# Patient Record
Sex: Female | Born: 1995 | Race: White | Hispanic: No | Marital: Single | Smoking: Never smoker
Health system: Southern US, Community
[De-identification: ages and names within clinical notes are randomized; demographics above are authoritative.]

## PROBLEM LIST (undated history)

## (undated) DIAGNOSIS — D649 Anemia, unspecified: Secondary | ICD-10-CM

## (undated) DIAGNOSIS — E669 Obesity, unspecified: Secondary | ICD-10-CM

## (undated) HISTORY — PX: MOUTH SURGERY: SHX715

## (undated) HISTORY — DX: Obesity, unspecified: E66.9

---

## 2016-02-23 ENCOUNTER — Ambulatory Visit
Admission: EM | Admit: 2016-02-23 | Discharge: 2016-02-23 | Disposition: A | Discharge status: Home / Self Care | Payer: BC Managed Care – PPO | Attending: Family Medicine | Admitting: Family Medicine

## 2016-02-23 DIAGNOSIS — H65192 Other acute nonsuppurative otitis media, left ear: Secondary | ICD-10-CM | POA: Diagnosis not present

## 2016-02-23 DIAGNOSIS — J029 Acute pharyngitis, unspecified: Secondary | ICD-10-CM | POA: Diagnosis not present

## 2016-02-23 LAB — RAPID STREP SCREEN (MED CTR MEBANE ONLY): STREPTOCOCCUS, GROUP A SCREEN (DIRECT): NEGATIVE

## 2016-02-23 MED ORDER — AMOXICILLIN 500 MG PO CAPS: 1000.0000 mg | ORAL_CAPSULE | Freq: Three times a day (TID) | ORAL | 0 refills | Status: AC

## 2016-02-23 NOTE — ED Provider Notes (Signed)
CSN: [id number]     Arrival date & time 02/23/16  4098 History   First MD Initiated Contact with Patient 02/23/16 [phone]     Chief Complaint  Patient presents with  . Otalgia  . Sore Throat   (Consider location/radiation/quality/duration/timing/severity/associated sxs/prior Treatment) Ms. Morgan Mendoza is a well-appearing 20 y.o female, presents today for Otalgia and Sore throat of 7 days' duration. She was seem by her PCP 7 days ago and was negative for strep. She was prescribed Ofloxacin Ophthalmic 0.3% to use for her ear pain. Patient presents today reporting that she is not improving, instead if worsening. She states that she "have never been in so much pain". She states the sore throat is miserable to swallow and her ear pain is 10/10. She endorses fever, chills, fatigue, congestion, sneezing, difficulty swallowing due to pain, coughing, body ache, and nausea and vomiting. The nausea and vomiting started yesterday.       History reviewed. No pertinent past medical history. History reviewed. No pertinent surgical history. History reviewed. No pertinent family history. Social History  Substance Use Topics  . Smoking status: Never Smoker  . Smokeless tobacco: Never Used  . Alcohol use Yes   OB History    No data available     Review of Systems  Constitutional: Positive for chills, fatigue and fever.  HENT: Positive for congestion, ear pain, rhinorrhea, sinus pressure, sneezing, sore throat and trouble swallowing. Negative for ear discharge.   Eyes: Negative for pain.  Respiratory: Positive for cough. Negative for shortness of breath.   Cardiovascular: Negative for chest pain, palpitations and leg swelling.  Gastrointestinal: Positive for nausea and vomiting. Negative for abdominal distention and diarrhea.  Musculoskeletal: Positive for myalgias. Negative for arthralgias.  Skin: Negative for rash.  Neurological: Positive for dizziness and headaches. Negative for syncope and weakness.     Allergies  Review of patient's allergies indicates no known allergies.  Home Medications   Prior to Admission medications   Medication Sig Start Date End Date Taking? Authorizing Provider  norethindrone-ethinyl estradiol-iron (ESTROSTEP FE,TILIA FE,TRI-LEGEST FE) 1-20/1-30/1-35 MG-MCG tablet Take 1 tablet by mouth daily.   Yes Historical Provider, MD  amoxicillin (AMOXIL) 500 MG capsule Take 2 capsules (1,000 mg total) by mouth 3 (three) times daily. 02/23/16 03/01/16  Lucia Estelle, NP   Meds Ordered and Administered this Visit  Medications - No data to display  BP 108/64 (BP Location: Left Arm)   Pulse (!) 103   Temp 99.1 F (37.3 C) (Oral)   Resp 18   Ht 5\' 9"  (1.753 m)   Wt 260 lb (117.9 kg)   LMP 02/08/2016   SpO2 100%   BMI 38.40 kg/m  No data found.   Physical Exam  Constitutional: She is oriented to person, place, and time. She appears well-developed and well-nourished.  HENT:  Head: Normocephalic and atraumatic.  Right Ear: External ear normal.  Left Ear: External ear normal.  Left tympanic membrane erythema present, no perforation. Right TM normal on exam. Tonsils 3+ with redness but no exudate. No peritonsillar abscess.   Eyes: Conjunctivae and EOM are normal. Pupils are equal, round, and reactive to light.  Neck: Normal range of motion. Neck supple.  Cardiovascular: Normal rate, regular rhythm and normal heart sounds.   Pulmonary/Chest: Effort normal and breath sounds normal. No respiratory distress.  Abdominal: Soft. Bowel sounds are normal. She exhibits no distension. There is no tenderness.  Lymphadenopathy:    She has no cervical adenopathy.  Neurological: She is alert  and oriented to person, place, and time.  Skin: Skin is warm. No rash noted.  Nursing note and vitals reviewed.   Urgent Care Course   Clinical Course    Procedures (including critical care time)  Labs Review Labs Reviewed  RAPID STREP SCREEN (NOT AT [facility])  CULTURE, GROUP A STREP  [facility])    Imaging Review No results found.    MDM   1. Other acute nonsuppurative otitis media of left ear   2. Pharyngitis    Prescriptions given for amoxicillin for AOM and presumptively for strep. Rapid strep negative however culture is pending. Also do salt water gargle, honey, butterscotch candy, throat lozenges for symptomatic relief. Take ibuprofen/tylenol for body ache and for fever (if present). Reviewed directions for usage and side effects. Patient states understanding and will call with questions or problems. Patient instructed to call or follow up with his/her primary care doctor if failure to improve or change in symptoms. Discharge instruction given. Patient denies any questions.     Lucia EstelleFeng Lulabelle Desta, NP 02/23/16 1017

## 2016-02-23 NOTE — ED Triage Notes (Signed)
Patient c/o ear ache and sore throat for about a week. She saw her PCP this week and prescribed her an ear drop. She hasn't gotten any better and is still having sore throat and ear pain.

## 2016-02-26 LAB — CULTURE, GROUP A STREP (THRC)

## 2016-02-27 ENCOUNTER — Telehealth (HOSPITAL_COMMUNITY): Payer: Self-pay | Admitting: Emergency Medicine

## 2016-02-27 NOTE — Telephone Encounter (Signed)
-----   Message from Morgan MooreLaura W Murray, MD sent at 02/26/2016  3:09 PM EDT ----- Please let patient know that throat cx was positive for a few beta hemolytic, non-group A strep. [facility].  Recheck or followup PCP for further evaluation if symptoms persist.  LM

## 2016-02-27 NOTE — Telephone Encounter (Signed)
[facility]  LM on pt's VM Need to see how pt is doing and to give lab results from recent visit on 9/16 Also let pt know labs can be obtained from MyChart

## 2016-09-19 ENCOUNTER — Encounter: Payer: Self-pay | Admitting: Certified Nurse Midwife

## 2016-09-19 ENCOUNTER — Ambulatory Visit (INDEPENDENT_AMBULATORY_CARE_PROVIDER_SITE_OTHER): Payer: BC Managed Care – PPO | Admitting: Certified Nurse Midwife

## 2016-09-19 VITALS — BP 130/70 | HR 89 | Ht 69.0 in | Wt 287.0 lb

## 2016-09-19 DIAGNOSIS — Z01419 Encounter for gynecological examination (general) (routine) without abnormal findings: Secondary | ICD-10-CM | POA: Diagnosis not present

## 2016-09-19 DIAGNOSIS — Z30011 Encounter for initial prescription of contraceptive pills: Secondary | ICD-10-CM | POA: Diagnosis not present

## 2016-09-19 DIAGNOSIS — Z309 Encounter for contraceptive management, unspecified: Secondary | ICD-10-CM | POA: Insufficient documentation

## 2016-09-19 DIAGNOSIS — Z124 Encounter for screening for malignant neoplasm of cervix: Secondary | ICD-10-CM

## 2016-09-19 MED ORDER — NORETHINDRON-ETHINYL ESTRAD-FE 1-20/1-30/1-35 MG-MCG PO TABS: 1.0000 | ORAL_TABLET | Freq: Every day | ORAL | 3 refills | Status: DC

## 2016-09-19 NOTE — Progress Notes (Signed)
Gynecology Annual Exam  PCP: Pcp Not In System  Chief Complaint:  Chief Complaint  Patient presents with  . Gynecologic Exam    History of Present Illness: Patient is a 21 y.o. G0P0000 presents for a NP annual exam. She has recently moved  from [place], [place] The patient has no complaints today. She wishes to resume her birth control pills. Her last annual was August 2016. She ran out of BCPs shortly after her move. Her menses off the pills have been regular, every 30 days, and usually they last 5 days with a moderate flow. Her LMP was 09/11/2016. She occasionally will have backache and thigh pain with her menses. She does not take medication for these problems. She is sexually active with her partner of 6 years. She is not using any contraception at this time.Her partner, Molly Maduro, will be deploying for a year tour soon.She declines STD testing.   The patient does not perform self breast exams.  There is no notable family history of breast or ovarian cancer in her family. She has never used tobacco products. She drinks 1-2 beer/ week.  [place] has regular exercise: yes.  She recently resumed exercising 4x/week. Current BMI=42.5kg/m2 She does get adequate calcium in her diet..   Her last cholesterol screen was in July 2017 and was normal.  Review of Systems: Review of Systems  Constitutional: Negative for chills, fever and weight loss.  HENT: Negative for congestion, sinus pain and sore throat.   Eyes: Negative for blurred vision and pain.  Respiratory: Negative for hemoptysis, shortness of breath and wheezing.   Cardiovascular: Negative for chest pain, palpitations and leg swelling.  Gastrointestinal: Negative for abdominal pain, blood in stool, diarrhea, heartburn, nausea and vomiting.  Genitourinary: Negative for dysuria, frequency, hematuria and urgency.  Musculoskeletal: Negative for back pain, joint pain and myalgias.  Skin: Negative for itching and rash.  Neurological:  Negative for dizziness, tingling and headaches.  Endo/Heme/Allergies: Negative for environmental allergies and polydipsia. Does not bruise/bleed easily.       Negative for hirsutism   Psychiatric/Behavioral: Negative for depression. The patient is not nervous/anxious and does not have insomnia.     Past Medical History:  Past Medical History:  Diagnosis Date  . Obesity    BMI 42 on 09/19/2016    Past Surgical History:  Past Surgical History:  Procedure Laterality Date  . MOUTH SURGERY     had tooth removed    Family History:  Family History  Problem Relation Age of Onset  . Hypertension Mother     also has a benign brain tumor  . Muscular dystrophy Cousin   . Breast cancer Neg Hx   . Deep vein thrombosis Neg Hx     Social History:  Social History   Social History  . Marital status: Single    Spouse name: N/A  . Number of children: N/A  . Years of education: N/A   Occupational History  . 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 Smoking status: Never Smoker  . Smokeless tobacco: Never Used  . Alcohol use 0.6 - 1.2 oz/week    1 - 2 Cans of beer per week  . Drug use: No  . Sexual activity: Yes    Partners: Male    Birth control/ protection: None   Other Topics Concern  . Not on file   Social History Narrative  . No narrative on file  Allergies:  No Known Allergies  Medications: Prior to Admission medications   Medication Sig Start Date End Date Taking? Authorizing Provider  norethindrone-ethinyl estradiol-iron (ESTROSTEP FE,TILIA FE,TRI-LEGEST FE) 1-20/1-30/1-35 MG-MCG tablet Take 1 tablet by mouth daily. 09/19/16  Yes Farrel Conners, CNM    Physical Exam Vitals: Blood pressure 130/70, pulse 89, height  (1.753 m), weight 130.2 kg (287 lb), last menstrual period 09/09/2016.  General: NAD, pleasant White female HEENT: normocephalic, anicteric Thyroid: no enlargement, no palpable nodules Pulmonary: No  increased work of breathing, CTAB Cardiovascular: RRR without murmur Breast: Breast symmetrical, no tenderness, no palpable nodules or masses, no skin or nipple retraction present, no nipple discharge.  No axillary, infraclavicular, or supraclavicular lymphadenopathy. Abdomen: obese, soft, non-tender, non-distended.  Umbilicus without lesions.  No hepatomegaly or masses palpable. No evidence of hernia  Genitourinary:  External: Normal external female genitalia.  Normal urethral meatus, normal Bartholin's and Skene's glands.    Vagina: Normal vaginal mucosa, no evidence of prolapse.    Cervix: Grossly normal in appearance, no bleeding, nullip  Uterus: AV, Non-enlarged, mobile, normal contour.    Adnexa: ovaries non-enlarged, no adnexal masses  Rectal: deferred  Lymphatic: no evidence of inguinal lymphadenopathy Extremities: no edema, erythema, or tenderness Neurologic: Grossly intact Psychiatric: mood appropriate, affect full      Assessment: 21 y.o. NP well woman exam Contraceptive management  Plan:  1) Pap smear done  2) STI screening was offered and declined  3) Contraception -RX for [employer] generic e-prescribed  4) Recommend monthly self breast exam  5) RTO in 1 year for annual  Farrel Conners, [place]

## 2016-09-23 LAB — IGP,RFX APTIMA HPV ALL PTH: PAP Smear Comment: 0

## 2016-09-29 ENCOUNTER — Ambulatory Visit
Admission: RE | Admit: 2016-09-29 | Discharge: 2016-09-29 | Disposition: A | Discharge status: Home / Self Care | Payer: BC Managed Care – PPO | Source: Ambulatory Visit | Attending: Emergency Medicine | Admitting: Emergency Medicine

## 2016-09-29 ENCOUNTER — Ambulatory Visit
Admission: EM | Admit: 2016-09-29 | Discharge: 2016-09-29 | Disposition: A | Discharge status: Home / Self Care | Payer: BC Managed Care – PPO | Attending: Family Medicine | Admitting: Family Medicine

## 2016-09-29 ENCOUNTER — Encounter: Payer: Self-pay | Admitting: *Deleted

## 2016-09-29 DIAGNOSIS — R10811 Right upper quadrant abdominal tenderness: Secondary | ICD-10-CM

## 2016-09-29 DIAGNOSIS — R1011 Right upper quadrant pain: Secondary | ICD-10-CM | POA: Diagnosis present

## 2016-09-29 DIAGNOSIS — R197 Diarrhea, unspecified: Secondary | ICD-10-CM | POA: Diagnosis not present

## 2016-09-29 DIAGNOSIS — R112 Nausea with vomiting, unspecified: Secondary | ICD-10-CM

## 2016-09-29 MED ORDER — ONDANSETRON 8 MG PO TBDP: 8.0000 mg | ORAL_TABLET | Freq: Two times a day (BID) | ORAL | 0 refills | Status: DC

## 2016-09-29 MED ORDER — ONDANSETRON 8 MG PO TBDP
8.0000 mg | ORAL_TABLET | Freq: Once | ORAL | Status: AC
  Administered 2016-09-29: 8 mg via ORAL

## 2016-09-29 NOTE — ED Provider Notes (Signed)
CSN: [id number]     Arrival date & time 09/29/16  1019 History   First MD Initiated Contact with Patient 09/29/16 1204     Chief Complaint  Patient presents with  . Emesis  . Nausea  . Headache  . Diarrhea  . Chills  . 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 She states that she has not had any nausea vomiting since that period of time but started having diarrhea the following day to 5 bowel movements daily. She denies any blood or mucus in the stools. She has had umbilical and right upper quadrant pain. Prior to the onset of the diarrhea she ate at [employer] [place] sandwich. Had head congestion as well as headache she states most likely from not eating. She is able to take in liquids without any difficulty. She has had no previous surgeries. Temperature today is 99.1, pulse 79, blood pressure 123/64, respirations 16 and O2 sats 100%       Past Medical History:  Diagnosis Date  . Obesity    BMI 42 on 09/19/2016   Past Surgical History:  Procedure Laterality Date  . MOUTH SURGERY     had tooth removed   Family History  Problem Relation Age of Onset  . Hypertension Mother     also has a benign brain tumor  . Muscular dystrophy Cousin   . Breast cancer Neg Hx   . Deep vein thrombosis Neg Hx    Social History  Substance Use Topics  . Smoking status: Never Smoker  . Smokeless tobacco: Never Used  . Alcohol use 0.6 - 1.2 oz/week    1 - 2 Cans of beer per week   OB History    Gravida Para Term Preterm AB Living   0 0 0 0 0 0   SAB TAB Ectopic Multiple Live Births   0 0 0 0 0     Review of Systems  Constitutional: Positive for activity change, appetite change and fatigue. Negative for chills and fever.  HENT: Positive for ear pain.   Gastrointestinal: Positive for  abdominal pain, diarrhea, nausea and vomiting.  All other systems reviewed and are negative.   Allergies  Other  Home Medications   Prior to Admission medications   Medication Sig Start Date End Date Taking? Authorizing Provider  norethindrone-ethinyl estradiol-iron (ESTROSTEP FE,TILIA FE,TRI-LEGEST FE) 1-20/1-30/1-35 MG-MCG tablet Take 1 tablet by mouth daily. 09/19/16  Yes Dalia Heading, CNM  ondansetron (ZOFRAN ODT) 8 MG disintegrating tablet Take 1 tablet (8 mg total) by mouth 2 (two) times daily. 09/29/16   Lorin Picket, PA-C   Meds Ordered and Administered this Visit   Medications  ondansetron (ZOFRAN-ODT) disintegrating tablet 8 mg (8 mg Oral Given 09/29/16 1256)    BP 123/64 (BP Location: Left Arm)   Pulse 79   Temp 99.1 F (37.3 C) (Oral)   Resp 16   Ht _0  (1.753 m)   Wt 245 lb (111.1 kg)   LMP 09/09/2016 (Exact Date)   SpO2 100%   BMI 36.18 kg/m  No data found.   Physical Exam  Constitutional: She is oriented to person, place, and time. She appears well-developed and well-nourished. No distress.  HENT:  Head: Normocephalic.  Eyes: Pupils are equal, round, and reactive to light.  Neck: Normal range of motion.  Pulmonary/Chest: Effort normal  and breath sounds normal. No respiratory distress. She has no wheezes. She has no rales.  Abdominal: Soft.  Abdominal exam was performed in the presence of Melissa, CMA as Education officer, museum. Patient is morbidly obese. Bowel sounds are hypotonic throughout. She has tenderness to deep palpation in the right upper quadrant with a mild Murphy's sign. Is also tender in the mid right quadrant superior to the umbilicus. She has no guarding no rebound tenderness. No masses are palpable.  Musculoskeletal: Normal range of motion.  Neurological: She is alert and oriented to person, place, and time.  Skin: Skin is warm and dry.  Psychiatric: She has a normal mood and affect. Her behavior is normal. Judgment and thought  content normal.  Nursing note and vitals reviewed.   Urgent Care Course     Procedures (including critical care time)  Labs Review Labs Reviewed - No data to display  Imaging Review US Abdomen Limited Ruq  Result Date: 09/29/2016 CLINICAL DATA:  Nausea, vomiting, diarrhea and RIGHT upper quadrant pain for 4 days EXAM: US ABDOMEN LIMITED - RIGHT UPPER QUADRANT COMPARISON:  None FINDINGS: Gallbladder: Normally distended without stones or wall thickening. Question minimal sludge. No pericholecystic fluid or sonographic Murphy sign. Common bile duct: Diameter: 6 mm diameter, upper normal Liver: Normal appearance without mass or intrahepatic biliary dilatation. Hepatopetal portal venous flow. No RIGHT upper quadrant free fluid. IMPRESSION: Question minimal sludge within gallbladder. Otherwise negative exam. Electronically Signed   By: Lavonia Dana M.D.   On: 09/29/2016 15:39     Visual Acuity Review  Right Eye Distance:   Left Eye Distance:   Bilateral Distance:    Right Eye Near:   Left Eye Near:    Bilateral Near:     Medications  ondansetron (ZOFRAN-ODT) disintegrating tablet 8 mg (8 mg Oral Given 09/29/16 1256)      MDM   1. Nausea vomiting and diarrhea   2. Right upper quadrant abdominal tenderness without rebound tenderness    Discharge Medication List as of 09/29/2016  1:01 PM    START taking these medications   Details  ondansetron (ZOFRAN ODT) 8 MG disintegrating tablet Take 1 tablet (8 mg total) by mouth 2 (two) times daily., Starting Mon 09/29/2016, Normal      Plan: 1. Test/x-ray results and diagnosis reviewed with patient 2. rx as per orders; risks, benefits, potential side effects reviewed with patient 3. Recommend supportive treatment with Fluids when she begins eating again and she should start with a brat diet and advance as tolerated. Send her for an ultrasound today as her symptoms and physical exam findings of right upper quadrant pain with a mildly  positive Murphy's sign. The ultrasound is negative then I will obtain GI panel by PCR. Patient was given a prescription and stool sample kit. If she is not improving she should follow-up with her primary care physician Dr. Ronnald Ramp. 4. F/u prn if symptoms worsen or don't improve     Lorin Picket, PA-C 09/29/16 1307    Addendum: Phone call from ultrasound; the patient had sludge but otherwise a normal study. The patient on the phone and relayed this message to her. She states that she was having still significant pain. Return to our clinic at which time I provided her with 3 prescriptions one for Mobic 15 mg that she will take daily PRN for pain. Carafate daily 4 times daily. Vicodin 10/10/2023 as necessary for severe pain. Referred to follow-up with surgery for further evaluation and management.  Name and number of a surgical group was given to the patient. She should also follow-up with her primary care or diarrhea does not resolve in several days. Continue taking in fluids.   Lorin Picket, PA-C 09/29/16 [place] Ariann Khaimov, PA-C 09/29/16 [place] Jhamal Plucinski, PA-C 09/29/16 [phone]

## 2016-09-29 NOTE — ED Triage Notes (Signed)
Onset of N/V/D with chills last Friday. Nausea has persisted with head congestion, diarrhea, chills, and ear fullness.

## 2016-09-30 ENCOUNTER — Ambulatory Visit (INDEPENDENT_AMBULATORY_CARE_PROVIDER_SITE_OTHER): Payer: BC Managed Care – PPO | Admitting: General Surgery

## 2016-09-30 ENCOUNTER — Encounter: Payer: Self-pay | Admitting: General Surgery

## 2016-09-30 ENCOUNTER — Other Ambulatory Visit
Admission: RE | Admit: 2016-09-30 | Discharge: 2016-09-30 | Disposition: A | Discharge status: Home / Self Care | Payer: BC Managed Care – PPO | Source: Ambulatory Visit | Attending: General Surgery | Admitting: General Surgery

## 2016-09-30 VITALS — BP 131/69 | HR 91 | Temp 98.2°F | Ht 69.0 in | Wt 288.0 lb

## 2016-09-30 DIAGNOSIS — R509 Fever, unspecified: Secondary | ICD-10-CM

## 2016-09-30 DIAGNOSIS — R1011 Right upper quadrant pain: Secondary | ICD-10-CM | POA: Insufficient documentation

## 2016-09-30 LAB — RAPID INFLUENZA A&B ANTIGENS
Influenza A (ARMC): NEGATIVE
Influenza B (ARMC): NEGATIVE

## 2016-09-30 NOTE — Progress Notes (Signed)
Patient ID: Morgan Mendoza, female   DOB: 1995-08-28, 21 y.o.   MRN: [id number]  CC: ABDOMINAL PAIN  HPI Morgan Mendoza is a 21 y.o. female presents to clinic for evaluation of right upper quadrant pain. She was seen in urgent care yesterday and had an ultrasound performed which showed sludge but no stones or cholecystitis. She had no labs or other imaging performed. Patient reports that her pain actually started last Thursday after eating a [place] sandwich from [place]. This was during a field trip with children. She had numerous bouts of nausea and vomiting Thursday and Friday. None since. She's had alternating diarrhea and constipation since that time. Her belly has had intermittent abdominal pains primarily in the mid epigastrium and some to the right upper quadrant. Patient reports she's never had pain like this before. On questioning she states she can eat fried and fatty foods without any abdominal pain. She denies any current fevers, chills, nausea, vomiting, chest pain, shortness of breath. Patient was told by urgent care that the gallbladder was her problem. Upon further questioning patient reports that the real reason she went to urgent care was to make sure this wasn't the flu.  HPI  Past Medical History:  Diagnosis Date  . Obesity    BMI 42 on 09/19/2016    Past Surgical History:  Procedure Laterality Date  . MOUTH SURGERY     had tooth removed    Family History  Problem Relation Age of Onset  . Hypertension Mother     also has a benign brain tumor  . Muscular dystrophy Cousin   . Breast cancer Neg Hx   . Deep vein thrombosis Neg Hx     Social History Social History  Substance Use Topics  . Smoking status: Never Smoker  . Smokeless tobacco: Never Used  . Alcohol use Yes     Comment: occasionally    Allergies  Allergen Reactions  . Other     Strawberries, kiwi and oranges    Current Outpatient Prescriptions  Medication Sig Dispense Refill  . meloxicam (MOBIC) 15  MG tablet Take 15 mg by mouth once.    . norethindrone-ethinyl estradiol-iron (ESTROSTEP FE,TILIA FE,TRI-LEGEST FE) 1-20/1-30/1-35 MG-MCG tablet Take 1 tablet by mouth daily. 84 tablet 3  . ondansetron (ZOFRAN ODT) 8 MG disintegrating tablet Take 1 tablet (8 mg total) by mouth 2 (two) times daily. 6 tablet 0  . sucralfate (CARAFATE) 1 g tablet Take 1 tablet by mouth 3 (three) times daily.    Marland Kitchen HYDROcodone-acetaminophen (NORCO/VICODIN) 5-325 MG tablet Take 1 tablet by mouth once as needed.     No current facility-administered medications for this visit.      Review of Systems A Multi-point review of systems was asked and was negative except for the findings documented in the history of present illness  Physical Exam Blood pressure 131/69, pulse 91, temperature 98.2 F (36.8 C), temperature source Oral, height  (1.753 m), weight 130.6 kg (288 lb), last menstrual period 09/09/2016. CONSTITUTIONAL: No acute distress. EYES: Pupils are equal, round, and reactive to light, Sclera are non-icteric. EARS, NOSE, MOUTH AND THROAT: The oropharynx is clear. The oral mucosa is pink and moist. Hearing is intact to voice. LYMPH NODES:  Lymph nodes in the neck are normal. RESPIRATORY:  Lungs are clear. There is normal respiratory effort, with equal breath sounds bilaterally, and without pathologic use of accessory muscles. CARDIOVASCULAR: Heart is regular without murmurs, gallops, or rubs. GI: The abdomen is large, soft, minimally tender  to deep palpation in the right upper quadrant with a negative Murphy sign, and nondistended. There are no palpable masses. There is no hepatosplenomegaly. There are normal bowel sounds in all quadrants. GU: Rectal deferred.   MUSCULOSKELETAL: Normal muscle strength and tone. No cyanosis or edema.   SKIN: Turgor is good and there are no pathologic skin lesions or ulcers. NEUROLOGIC: Motor and sensation is grossly normal. Cranial nerves are grossly intact. PSYCH:   Oriented to person, place and time. Affect is normal.  Data Reviewed Ultrasound from yesterday shows minimal sludge but no evidence of gallstones, gallbladder wall thickening, pericholecystic fluid, ductal dilatation. I have personally reviewed the patient's imaging, laboratory findings and medical records.    Assessment    Abdominal pain    Plan    21 year old female with abdominal pain that is most consistent with an enteritis. The only workup performed by urgent care was an ultrasound which showed sludge and no evidence of biliary disease. Given the lack of findings on the ultrasound and the presenting history discussed with the patient that this should be a self-limiting enteritis. Although, since she is worried about the flu we will order a flu test. Discussed at length the signs and symptoms of biliary colic and to return to clinic should they occur. If they recur we will then obtain labs to include liver function tests, and a repeat ultrasound versus HIDA scan depending on the time between visits. Patient voiced understanding and will follow-up in clinic on an as-needed basis. We will call her with the results of her flu test.     Time spent with the patient was 30 minutes, with more than 50% of the time spent in face-to-face education, counseling and care coordination.     Ricarda Frame, MD FACS General Surgeon 09/30/2016, 10:06 AM

## 2016-09-30 NOTE — Patient Instructions (Addendum)
Follow up with our office if you begin to feel any abdominal pain.  We will call you with your flu test results.

## 2016-10-01 ENCOUNTER — Telehealth: Payer: Self-pay

## 2016-10-01 NOTE — Telephone Encounter (Signed)
Called patient at this time to inform her of her Flu Test results. Left message for patient to call back for results.

## 2017-06-09 NOTE — L&D Delivery Note (Signed)
Delivery Note  First Stage: Labor onset: 0930 Induction: cytotec x 1, Pitocin Analgesia /Anesthesia intrapartum: Epidural SROM at 1030, clear fluid  Second Stage: Complete dilation at 1650 Onset of pushing at 1654 FHR second stage Cat I tracing  Delivery of a viable female on 09/03/17 at 1703 by R Jamar CNM delivery of fetal head in LOA position with restitution to LOT; Loose nuchal cord x 1, reduced at perineum;  Anterior then posterior shoulders delivered easily with gentle downward traction. Baby placed on mom's chest, and attended to by peds.  Cord double clamped after cessation of pulsation, cut by FOB Cord blood sample collected   Third Stage: Placenta delivered Spontaneously intact with 3VC @ 1708, noted to have succenturiate lobe. Placenta disposition: routine disposal Uterine tone: Firm / bleeding: small  Right vaginal wall and 2nd deg perineal laceration identified  Anesthesia for repair: epidural Repair: 2-0 Vicryl CT-1 and 3-0 Vicryl SH x 1 repaired in usual fashion Est. Blood Loss (mL): 250  Complications: none  Mom to postpartum.  Baby to Couplet care / Skin to Skin.  Newborn: Birth Weight: 7#0  Apgar Scores: 8/9 Feeding planned: breast

## 2017-06-10 LAB — OB RESULTS CONSOLE VARICELLA ZOSTER ANTIBODY, IGG: Varicella: IMMUNE

## 2017-06-10 LAB — OB RESULTS CONSOLE RUBELLA ANTIBODY, IGM: Rubella: IMMUNE

## 2017-06-10 LAB — OB RESULTS CONSOLE HEPATITIS B SURFACE ANTIGEN: Hepatitis B Surface Ag: NEGATIVE

## 2017-06-11 ENCOUNTER — Encounter: Payer: BC Managed Care – PPO | Admitting: Obstetrics and Gynecology

## 2017-08-18 LAB — OB RESULTS CONSOLE GC/CHLAMYDIA
Chlamydia: NEGATIVE
Gonorrhea: NEGATIVE

## 2017-08-18 LAB — OB RESULTS CONSOLE GBS: STREP GROUP B AG: POSITIVE

## 2017-08-18 LAB — OB RESULTS CONSOLE HIV ANTIBODY (ROUTINE TESTING): HIV: NONREACTIVE

## 2017-08-18 LAB — OB RESULTS CONSOLE RPR: RPR: NONREACTIVE

## 2017-08-28 ENCOUNTER — Other Ambulatory Visit: Payer: Self-pay | Admitting: Obstetrics and Gynecology

## 2017-09-03 ENCOUNTER — Inpatient Hospital Stay: Payer: BC Managed Care – PPO | Admitting: Anesthesiology

## 2017-09-03 ENCOUNTER — Encounter: Payer: Self-pay | Admitting: *Deleted

## 2017-09-03 ENCOUNTER — Inpatient Hospital Stay
Admission: EM | Admit: 2017-09-03 | Discharge: 2017-09-05 | DRG: 806 | Disposition: A | Discharge status: Home / Self Care | Payer: BC Managed Care – PPO | Source: Ambulatory Visit | Attending: Obstetrics and Gynecology | Admitting: Obstetrics and Gynecology

## 2017-09-03 ENCOUNTER — Other Ambulatory Visit: Payer: Self-pay

## 2017-09-03 DIAGNOSIS — Z3A38 38 weeks gestation of pregnancy: Secondary | ICD-10-CM | POA: Diagnosis not present

## 2017-09-03 DIAGNOSIS — O139 Gestational [pregnancy-induced] hypertension without significant proteinuria, unspecified trimester: Secondary | ICD-10-CM | POA: Diagnosis present

## 2017-09-03 DIAGNOSIS — O99214 Obesity complicating childbirth: Secondary | ICD-10-CM | POA: Diagnosis present

## 2017-09-03 DIAGNOSIS — O99824 Streptococcus B carrier state complicating childbirth: Secondary | ICD-10-CM | POA: Diagnosis present

## 2017-09-03 DIAGNOSIS — O134 Gestational [pregnancy-induced] hypertension without significant proteinuria, complicating childbirth: Secondary | ICD-10-CM | POA: Diagnosis present

## 2017-09-03 DIAGNOSIS — K8020 Calculus of gallbladder without cholecystitis without obstruction: Secondary | ICD-10-CM | POA: Diagnosis present

## 2017-09-03 DIAGNOSIS — O9081 Anemia of the puerperium: Secondary | ICD-10-CM | POA: Diagnosis not present

## 2017-09-03 DIAGNOSIS — O26893 Other specified pregnancy related conditions, third trimester: Secondary | ICD-10-CM | POA: Diagnosis present

## 2017-09-03 DIAGNOSIS — O9962 Diseases of the digestive system complicating childbirth: Secondary | ICD-10-CM | POA: Diagnosis present

## 2017-09-03 DIAGNOSIS — Z6791 Unspecified blood type, Rh negative: Secondary | ICD-10-CM

## 2017-09-03 DIAGNOSIS — D62 Acute posthemorrhagic anemia: Secondary | ICD-10-CM | POA: Diagnosis not present

## 2017-09-03 HISTORY — DX: Anemia, unspecified: D64.9

## 2017-09-03 LAB — URINALYSIS, COMPLETE (UACMP) WITH MICROSCOPIC
Bilirubin Urine: NEGATIVE
GLUCOSE, UA: NEGATIVE mg/dL
KETONES UR: NEGATIVE mg/dL
Nitrite: NEGATIVE
PROTEIN: 30 mg/dL — AB
Specific Gravity, Urine: 1.024 (ref 1.005–1.030)
pH: 6 (ref 5.0–8.0)

## 2017-09-03 LAB — COMPREHENSIVE METABOLIC PANEL
ALBUMIN: 2.7 g/dL — AB (ref 3.5–5.0)
ALT: 29 U/L (ref 14–54)
AST: 30 U/L (ref 15–41)
Alkaline Phosphatase: 129 U/L — ABNORMAL HIGH (ref 38–126)
Anion gap: 10 (ref 5–15)
BILIRUBIN TOTAL: 0.4 mg/dL (ref 0.3–1.2)
BUN: 9 mg/dL (ref 6–20)
CHLORIDE: 109 mmol/L (ref 101–111)
CO2: 19 mmol/L — AB (ref 22–32)
Calcium: 8.2 mg/dL — ABNORMAL LOW (ref 8.9–10.3)
Creatinine, Ser: 0.55 mg/dL (ref 0.44–1.00)
GFR calc Af Amer: >60 mL/min (ref >60)
GFR calc non Af Amer: >60 mL/min (ref >60)
GLUCOSE: 87 mg/dL (ref 65–99)
POTASSIUM: 4 mmol/L (ref 3.5–5.1)
SODIUM: 138 mmol/L (ref 135–145)
Total Protein: 6.6 g/dL (ref 6.5–8.1)

## 2017-09-03 LAB — CBC
HCT: 31.5 % — ABNORMAL LOW (ref 35.0–47.0)
Hemoglobin: 10.4 g/dL — ABNORMAL LOW (ref 12.0–16.0)
MCH: 25.6 pg — ABNORMAL LOW (ref 26.0–34.0)
MCHC: 32.9 g/dL (ref 32.0–36.0)
MCV: 77.8 fL — ABNORMAL LOW (ref 80.0–100.0)
PLATELETS: 303 10*3/uL (ref 150–440)
RBC: 4.05 MIL/uL (ref 3.80–5.20)
RDW: 15.9 % — AB (ref 11.5–14.5)
WBC: 14.7 10*3/uL — AB (ref 3.6–11.0)

## 2017-09-03 LAB — CHLAMYDIA/NGC RT PCR (ARMC ONLY)
CHLAMYDIA TR: NOT DETECTED
N GONORRHOEAE: NOT DETECTED

## 2017-09-03 LAB — PROTEIN / CREATININE RATIO, URINE
Creatinine, Urine: 256 mg/dL
PROTEIN CREATININE RATIO: 0.13 mg/mg{creat} (ref 0.00–0.15)
Total Protein, Urine: 33 mg/dL

## 2017-09-03 LAB — ABO/RH: ABO/RH(D): A NEG

## 2017-09-03 LAB — TSH: TSH: 2.335 u[IU]/mL (ref 0.350–4.500)

## 2017-09-03 MED ORDER — OXYTOCIN 40 UNITS IN LACTATED RINGERS INFUSION - SIMPLE MED
1.0000 m[IU]/min | INTRAVENOUS | Status: DC
  Administered 2017-09-03: 2 m[IU]/min via INTRAVENOUS

## 2017-09-03 MED ORDER — FENTANYL CITRATE (PF) 100 MCG/2ML IJ SOLN
50.0000 ug | INTRAMUSCULAR | Status: DC | PRN
Start: 2017-09-03 — End: 2017-09-03

## 2017-09-03 MED ORDER — ZOLPIDEM TARTRATE 5 MG PO TABS: 5.0000 mg | ORAL_TABLET | Freq: Every evening | ORAL | Status: DC | PRN

## 2017-09-03 MED ORDER — MISOPROSTOL 25 MCG QUARTER TABLET
25.0000 ug | ORAL_TABLET | ORAL | Status: DC | PRN
  Administered 2017-09-03: 25 ug via VAGINAL
  Filled 2017-09-03: qty 1

## 2017-09-03 MED ORDER — ACETAMINOPHEN 325 MG PO TABS: 650.0000 mg | ORAL_TABLET | ORAL | Status: DC | PRN

## 2017-09-03 MED ORDER — DIPHENHYDRAMINE HCL 25 MG PO CAPS: 25.0000 mg | ORAL_CAPSULE | Freq: Four times a day (QID) | ORAL | Status: DC | PRN

## 2017-09-03 MED ORDER — SIMETHICONE 80 MG PO CHEW: 80.0000 mg | CHEWABLE_TABLET | ORAL | Status: DC | PRN

## 2017-09-03 MED ORDER — FENTANYL 2.5 MCG/ML W/ROPIVACAINE 0.15% IN NS 100 ML EPIDURAL (ARMC)
EPIDURAL | Status: AC
  Filled 2017-09-03: qty 100

## 2017-09-03 MED ORDER — BENZOCAINE-MENTHOL 20-0.5 % EX AERO
1.0000 "application " | INHALATION_SPRAY | CUTANEOUS | Status: DC | PRN
  Administered 2017-09-05 (×2): 1 via TOPICAL
  Filled 2017-09-03 (×2): qty 56

## 2017-09-03 MED ORDER — SOD CITRATE-CITRIC ACID 500-334 MG/5ML PO SOLN
30.0000 mL | ORAL | Status: DC | PRN
  Administered 2017-09-03: 30 mL via ORAL
  Filled 2017-09-03: qty 15

## 2017-09-03 MED ORDER — LACTATED RINGERS IV SOLN: 500.0000 mL | INTRAVENOUS | Status: DC | PRN

## 2017-09-03 MED ORDER — MISOPROSTOL 25 MCG QUARTER TABLET
25.0000 ug | ORAL_TABLET | ORAL | Status: DC
  Administered 2017-09-03: 25 ug via BUCCAL
  Filled 2017-09-03: qty 1

## 2017-09-03 MED ORDER — CYCLOBENZAPRINE HCL 5 MG PO TABS
5.0000 mg | ORAL_TABLET | Freq: Three times a day (TID) | ORAL | Status: DC | PRN
  Administered 2017-09-03: 5 mg via ORAL
  Filled 2017-09-03 (×2): qty 1

## 2017-09-03 MED ORDER — IBUPROFEN 600 MG PO TABS: 600.0000 mg | ORAL_TABLET | Freq: Four times a day (QID) | ORAL | Status: DC

## 2017-09-03 MED ORDER — MISOPROSTOL 200 MCG PO TABS
ORAL_TABLET | ORAL | Status: AC
  Filled 2017-09-03: qty 4

## 2017-09-03 MED ORDER — ONDANSETRON HCL 4 MG/2ML IJ SOLN
4.0000 mg | INTRAMUSCULAR | Status: DC | PRN
  Administered 2017-09-03: 4 mg via INTRAVENOUS
  Filled 2017-09-03: qty 2

## 2017-09-03 MED ORDER — SENNOSIDES-DOCUSATE SODIUM 8.6-50 MG PO TABS
2.0000 | ORAL_TABLET | ORAL | Status: DC
  Administered 2017-09-04 – 2017-09-05 (×2): 2 via ORAL
  Filled 2017-09-03 (×2): qty 2

## 2017-09-03 MED ORDER — OXYTOCIN BOLUS FROM INFUSION: 500.0000 mL | Freq: Once | INTRAVENOUS | Status: DC

## 2017-09-03 MED ORDER — OXYTOCIN 10 UNIT/ML IJ SOLN
INTRAMUSCULAR | Status: AC
Start: 2017-09-03 — End: 2017-09-03
  Filled 2017-09-03: qty 2

## 2017-09-03 MED ORDER — LIDOCAINE HCL (PF) 1 % IJ SOLN
INTRAMUSCULAR | Status: DC | PRN
  Administered 2017-09-03: 3 mL via INTRADERMAL

## 2017-09-03 MED ORDER — ONDANSETRON HCL 4 MG/2ML IJ SOLN: 4.0000 mg | Freq: Once | INTRAMUSCULAR | Status: DC | PRN

## 2017-09-03 MED ORDER — LIDOCAINE HCL (PF) 1 % IJ SOLN
INTRAMUSCULAR | Status: AC
  Filled 2017-09-03: qty 30

## 2017-09-03 MED ORDER — FENTANYL 2.5 MCG/ML W/ROPIVACAINE 0.15% IN NS 100 ML EPIDURAL (ARMC)
EPIDURAL | Status: DC | PRN
  Administered 2017-09-03: 12 mL/h via EPIDURAL

## 2017-09-03 MED ORDER — ACETAMINOPHEN 325 MG PO TABS
650.0000 mg | ORAL_TABLET | ORAL | Status: DC | PRN
  Administered 2017-09-03: 650 mg via ORAL
  Filled 2017-09-03: qty 2

## 2017-09-03 MED ORDER — OXYTOCIN 40 UNITS IN LACTATED RINGERS INFUSION - SIMPLE MED
2.5000 [IU]/h | INTRAVENOUS | Status: DC
  Filled 2017-09-03: qty 1000

## 2017-09-03 MED ORDER — LACTATED RINGERS IV SOLN
INTRAVENOUS | Status: DC
  Administered 2017-09-03: 02:00:00 via INTRAVENOUS

## 2017-09-03 MED ORDER — ONDANSETRON HCL 4 MG/2ML IJ SOLN
4.0000 mg | Freq: Four times a day (QID) | INTRAMUSCULAR | Status: DC | PRN
  Administered 2017-09-03: 4 mg via INTRAVENOUS
  Filled 2017-09-03: qty 2

## 2017-09-03 MED ORDER — ONDANSETRON HCL 4 MG PO TABS: 4.0000 mg | ORAL_TABLET | ORAL | Status: DC | PRN

## 2017-09-03 MED ORDER — SODIUM CHLORIDE 0.9 % IV SOLN
5.0000 10*6.[IU] | Freq: Once | INTRAVENOUS | Status: AC
  Administered 2017-09-03: 5 10*6.[IU] via INTRAVENOUS
  Filled 2017-09-03: qty 5

## 2017-09-03 MED ORDER — PRENATAL MULTIVITAMIN CH
1.0000 | ORAL_TABLET | Freq: Every day | ORAL | Status: DC
  Administered 2017-09-04 – 2017-09-05 (×2): 1 via ORAL
  Filled 2017-09-03 (×2): qty 1

## 2017-09-03 MED ORDER — BUPIVACAINE HCL (PF) 0.25 % IJ SOLN
INTRAMUSCULAR | Status: DC | PRN
  Administered 2017-09-03 (×2): 5 mL via EPIDURAL

## 2017-09-03 MED ORDER — AMMONIA AROMATIC IN INHA
RESPIRATORY_TRACT | Status: AC
  Filled 2017-09-03: qty 10

## 2017-09-03 MED ORDER — LIDOCAINE HCL (PF) 1 % IJ SOLN: 30.0000 mL | INTRAMUSCULAR | Status: DC | PRN

## 2017-09-03 MED ORDER — OXYCODONE-ACETAMINOPHEN 5-325 MG PO TABS
1.0000 | ORAL_TABLET | Freq: Four times a day (QID) | ORAL | Status: DC | PRN
  Administered 2017-09-03: 1 via ORAL
  Filled 2017-09-03: qty 1

## 2017-09-03 MED ORDER — DIBUCAINE 1 % RE OINT: 1.0000 "application " | TOPICAL_OINTMENT | RECTAL | Status: DC | PRN

## 2017-09-03 MED ORDER — COCONUT OIL OIL: 1.0000 "application " | TOPICAL_OIL | Status: DC | PRN

## 2017-09-03 MED ORDER — IBUPROFEN 600 MG PO TABS
ORAL_TABLET | ORAL | Status: AC
  Filled 2017-09-03: qty 1

## 2017-09-03 MED ORDER — LIDOCAINE-EPINEPHRINE (PF) 1.5 %-1:200000 IJ SOLN
INTRAMUSCULAR | Status: DC | PRN
  Administered 2017-09-03: 3 mL via EPIDURAL

## 2017-09-03 MED ORDER — PENICILLIN G POT IN DEXTROSE 60000 UNIT/ML IV SOLN
3.0000 10*6.[IU] | INTRAVENOUS | Status: DC
  Administered 2017-09-03 (×2): 3 10*6.[IU] via INTRAVENOUS
  Filled 2017-09-03 (×10): qty 50

## 2017-09-03 MED ORDER — FENTANYL CITRATE (PF) 100 MCG/2ML IJ SOLN: 25.0000 ug | INTRAMUSCULAR | Status: DC | PRN

## 2017-09-03 MED ORDER — WITCH HAZEL-GLYCERIN EX PADS: 1.0000 "application " | MEDICATED_PAD | CUTANEOUS | Status: DC | PRN

## 2017-09-03 MED ORDER — IBUPROFEN 600 MG PO TABS
600.0000 mg | ORAL_TABLET | Freq: Four times a day (QID) | ORAL | Status: DC
  Administered 2017-09-03 – 2017-09-05 (×8): 600 mg via ORAL
  Filled 2017-09-03 (×7): qty 1

## 2017-09-03 MED ORDER — TERBUTALINE SULFATE 1 MG/ML IJ SOLN: 0.2500 mg | Freq: Once | INTRAMUSCULAR | Status: DC | PRN

## 2017-09-03 NOTE — Discharge Summary (Signed)
Obstetrical Discharge Summary  Patient Name: Morgan Mendoza DOB: Jun 27, 1995 MRN: [id number]  Date of Admission: 09/03/2017 Date of Delivery: 09/03/17 Delivered by: Bonnell Public CNM Date of Discharge: 09/05/2017  Primary OB: [facility]  WUJ:[id number]'W last menstrual period was 12/10/2016. EDC Estimated Date of Delivery: 09/12/17 Gestational Age at Delivery: [redacted]w[redacted]d   Antepartum complications:  1. Rh Negative, s/p Rhogam 2. Morbid obesity, BMI 42 3. Anemia 4. Gestational HTN, no meds. Dx'd at 37wks.  5. Late to care - unknown pregnancy 6. gallstones 7. GBS positive  Admitting Diagnosis: GHTN Secondary Diagnosis: SVD, 2nd deg laceration, nuchal cord x 1  Patient Active Problem List   Diagnosis Date Noted  . Gestational hypertension 09/03/2017  . Right upper quadrant abdominal pain 09/30/2016  . Contraceptive management 09/19/2016    Augmentation: Pitocin and Cytotec Complications: None Intrapartum complications/course: IOL for GHTN, uncomplicated IP course. SVD at 1703 with loose nuchal cord x 1.  Date of Delivery: 09/03/17 Delivered By: Bonnell Public CNM Delivery Type: spontaneous vaginal delivery Anesthesia: epidural Placenta: spontaneous Laceration: vaginal wall, 2nd deg perineal Episiotomy: none Newborn Data: Live born female  Birth Weight: 7 lb 0.2 oz (3180 g) APGAR: 8, 9  Newborn Delivery   Birth date/time:  09/03/2017 17:03:00 Delivery type:  Vaginal, Spontaneous     Postpartum Procedures: None  Post partum course:  Patient had an uncomplicated postpartum course.  By time of discharge on PPD#2, her pain was controlled on oral pain medications; she had appropriate lochia and was ambulating, voiding without difficulty and tolerating regular diet.  She was deemed stable for discharge to home.      Discharge Physical Exam:  BP (!) 108/56 (BP Location: Right Arm) Comment: nurse Abby Gurner notified  Pulse 74   Temp 98.4 F (36.9 C) (Oral)   Resp 18   Ht 5\' 9"   (1.753 m)   Wt 124.7 kg (275 lb)   LMP 12/10/2016   SpO2 99%   Breastfeeding? Unknown   BMI 40.61 kg/m   General: NAD CV: RRR Pulm: CTABL, nl effort ABD: s/nd/nt, fundus firm and below the umbilicus Lochia: moderate DVT Evaluation: LE non-ttp, no evidence of DVT on exam.  Hemoglobin  Date Value Ref Range Status  09/05/2017 9.8 (L) 12.0 - 16.0 g/dL Final   HCT  Date Value Ref Range Status  09/05/2017 30.0 (L) 35.0 - 47.0 % Final     Disposition: stable, discharge to home. Baby Feeding: breastmilk Baby Disposition: home with mom  Rh Immune globulin given:  Rubella vaccine given: n/a Varicella vaccine given: n/a Tdap vaccine given in AP or PP setting: AP Flu vaccine given in AP or PP setting: AP  Contraception: IUD  Prenatal Labs:  Blood type/Rh A neg  Antibody screen neg  Rubella Immune  Varicella Immune  RPR NR  HBsAg Neg  HIV NR  GC neg  Chlamydia neg  Genetic screening Not done  1 hour GTT 110  3 hour GTT n/a  GBS positive    Plan:  Mendy Chou was discharged to home in good condition. Follow-up appointment with delivering provider in 6 weeks.  Discharge Medications: Allergies as of 09/05/2017      Reactions   Other    Strawberries, kiwi and oranges      Medication List    STOP taking these medications   HYDROcodone-acetaminophen 5-325 MG tablet Commonly known as:  NORCO/VICODIN   meloxicam 15 MG tablet Commonly known as:  MOBIC   norethindrone-ethinyl estradiol-iron 1-20/1-30/1-35 MG-MCG tablet Commonly  known as:  ESTROSTEP FE,TILIA FE,TRI-LEGEST FE   ondansetron 8 MG disintegrating tablet Commonly known as:  ZOFRAN ODT   sucralfate 1 g tablet Commonly known as:  CARAFATE     TAKE these medications   ferrous sulfate 325 (65 FE) MG EC tablet Take 1 tablet (325 mg total) by mouth 2 (two) times daily with a meal. What changed:  when to take this   ibuprofen 600 MG tablet Commonly known as:  ADVIL,MOTRIN Take 1 tablet (600 mg  total) by mouth every 6 (six) hours.   prenatal vitamin w/FE, FA 27-1 MG Tabs tablet Take 1 tablet by mouth daily at 12 noon.   senna-docusate 8.6-50 MG tablet Commonly known as:  Senokot-S Take 2 tablets by mouth at bedtime as needed for mild constipation.       Follow-up Information    Amparan, Prudencio PairRebecca A, CNM. Schedule an appointment as soon as possible for a visit in 6 week(s).   Specialty:  Obstetrics and Gynecology Contact information: [address] [place] [id number] [phone]           Signed:  Genia DelMargaret Jamarkis Branam, CNM 09/05/2017 10:06 AM

## 2017-09-03 NOTE — H&P (Signed)
OB History & Physical   History of Present Illness:  Chief Complaint:   HPI:  Morgan Mendoza is a 22 y.o. G1P0000 female at [redacted]w[redacted]d dated by 25 week ultrasound with EDC of 09/12/17. LMP estimated to be 12/06/16. She presents to L&D for scheduled IOL due to gestational hypertension at term  +FM, no CTX, no LOF, no VB  Pregnancy Issues: 1. Rh Negative, s/p Rhogam 2. Morbid obesity, BMI 42 3. Anemia 4. Gestational HTN, no meds. Dx'd at 37wks.  5. Late to care - unknown pregnancy 6. gallstones 7. GBS positive  Maternal Medical History:   Past Medical History:  Diagnosis Date  . Anemia   . Obesity    BMI 42 on 09/19/2016    Past Surgical History:  Procedure Laterality Date  . MOUTH SURGERY     had tooth removed    Allergies  Allergen Reactions  . Other     Strawberries, kiwi and oranges    Prior to Admission medications   Medication Sig Start Date End Date Taking? Authorizing Provider  ferrous sulfate 325 (65 FE) MG EC tablet Take 325 mg by mouth 3 (three) times daily with meals.   Yes [provider]  prenatal vitamin w/FE, FA (PRENATAL 1 + 1) 27-1 MG TABS tablet Take 1 tablet by mouth daily at 12 noon.   Yes [provider]  HYDROcodone-acetaminophen (NORCO/VICODIN) 5-325 MG tablet Take 1 tablet by mouth once as needed. 09/29/16   [provider]  meloxicam (MOBIC) 15 MG tablet Take 15 mg by mouth once. 09/29/16   [provider]  norethindrone-ethinyl estradiol-iron (ESTROSTEP FE,TILIA FE,TRI-LEGEST FE) 1-20/1-30/1-35 MG-MCG tablet Take 1 tablet by mouth daily. Patient not taking: Reported on 09/03/2017 09/19/16   Farrel Conners, CNM  ondansetron (ZOFRAN ODT) 8 MG disintegrating tablet Take 1 tablet (8 mg total) by mouth 2 (two) times daily. Patient not taking: Reported on 09/03/2017 09/29/16   Ovid Curd P, PA-C  sucralfate (CARAFATE) 1 g tablet Take 1 tablet by mouth 3 (three) times daily. 09/29/16   [provider]      Prenatal care site: [facility]   Social History: She  reports that she has never smoked. She has never used smokeless tobacco. She reports that she drank alcohol. She reports that she does not use drugs.  Family History: family history includes Hypertension in her mother; Muscular dystrophy in her cousin.   Review of Systems: A full review of systems was performed and negative except as noted in the HPI.     Physical Exam:  Vital Signs: BP 111/71   Pulse (!) 48   Temp 98.3 F (36.8 C) (Oral)   Resp 18   Ht 5\' 9"  (1.753 m)   Wt 124.7 kg (275 lb)   LMP 12/10/2016   BMI 40.61 kg/m  General: no acute distress.  HEENT: normocephalic, atraumatic Heart: regular rate & rhythm.  No murmurs/rubs/gallops Lungs: clear to auscultation bilaterally, normal respiratory effort Abdomen: soft, gravid, non-tender;  EFW: 7.9 Pelvic:   External: Normal external female genitalia  Cervix: 3/60/-2   Extremities: non-tender, symmetric, 1+ edema bilaterally.  DTRs: 2+  Neurologic: Alert & oriented x 3.     Pertinent Results:  Prenatal Labs: Blood type/Rh A neg  Antibody screen neg  Rubella Immune  Varicella Immune  RPR NR  HBsAg Neg  HIV NR  GC neg  Chlamydia neg  Genetic screening Not done  1 hour GTT 110  3 hour GTT   GBS  positive   FHT: 130 mod +accels no decels TOCO: q 8min SVE: 3/60/-2   Cephalic by leopolds   Assessment:  Morgan Mendoza is a 22 y.o. G1P0000 female at 6215w5d with IOL for GHTN.   Plan:  1. Admit to Labor & Delivery 2. CBC, T&S, Clrs, IVF 3. GBS post - ABX ordered, q4h until delivery 4. Consents obtained. 5. Continuous efm/toco 6. IUP: category 1 7. IOL: cytotec buccally and vaginally, 25mcg each, and await response, q4h PRN. 8. GHTN: Normotensive on admission; check preeclampsia labs and monitor for development of symptoms.  ----- Ranae Plumberhelsea Maxx Pham, MD Attending Obstetrician and Gynecologist [facility], Department of OB/GYN [place] [facility]

## 2017-09-03 NOTE — Progress Notes (Signed)
Labor Progress Note  Morgan Mendoza is a 22 y.o. G1P0000 at 1076w5d by US at 25+5wks, presented [facility] L&D [facility]   Subjective: comfortable with epidural, feeling no pain or pressure.   Objective: BP (!) 91/49   Pulse 63   Temp 97.7 F (36.5 C) (Oral)   Resp 18   Ht 5\' 9"  (1.753 m)   Wt 275 lb (124.7 kg)   LMP 12/10/2016   SpO2 100%   BMI 40.61 kg/m  Notable VS details: reviewed  Fetal Assessment: FHT:  FHR: 140 bpm, variability: moderate,  accelerations:  Present,  decelerations:  Present early/variable decels Category/reactivity:  Category II UC:   regular, every 2-4 minutes; Pitocin at 7512mu/min, UCs not tracing well on pt side, IUPC placed without difficulty.  SVE:   6/80/-1; bloody show, some molding of head noted.  Membrane status: SROM at 1030 Amniotic color: Clear  Labs: Lab Results  Component Value Date   WBC 14.7 (H) 09/03/2017   HGB 10.4 (L) 09/03/2017   HCT 31.5 (L) 09/03/2017   MCV 77.8 (L) 09/03/2017   PLT 303 09/03/2017    Assessment / Plan: [facility] 38.5 [facility]; entering Active labor, FHR cat II tracing with variable and early decels.     Labor: Progressing well, Cat II tracing, repositioned, IV bolus for BP 90/40s; IUPC assessing MVUs Reviewed tracing with Dr Elesa MassedWard.  Preeclampsia:  no e/o pre-e Fetal Wellbeing:  Category II , position changes, early decels with UCs.  Pain Control:  Epidural I/D:  PCN q4h IVPB Anticipated MOD:  NSVD  Mendoza, Morgan Bissette A, CNM 09/03/2017, 1:36 PM

## 2017-09-03 NOTE — Progress Notes (Addendum)
Labor Progress Note  Morgan Mendoza is a 22 y.o. G1P0000 at 8713w5d by US at 25+5wks, presented to L&D [facility]  Subjective: denies HA, VD. Having some upper shoulder and epigastric pain, mild nausea relieved by Zofran. Feeling painful UCs, 4/10, back and lower abdomen.   Objective: BP (!) 127/53   Pulse (!) 40   Temp 97.7 F (36.5 C) (Oral)   Resp 18   Ht 5\' 9"  (1.753 m)   Wt 275 lb (124.7 kg)   LMP 12/10/2016   BMI 40.61 kg/m  Notable VS details: reviewed. Pulse remains low, baseline during prenatal visits 70-80s.   Fetal Assessment: FHT:  FHR: 140 bpm, variability: moderate,  accelerations:  Present,  decelerations:  Absent Category/reactivity:  Category I UC:   regular, every 2-4 minutes; some coupling/tripling noted.  Last SVE (approx 0630): Dilation: 4 Effacement (%): 70 Station: -2 Presentation: Vertex Exam by:: Dietrich PatesS Norris, RN   Membrane status:intact   Labs: Lab Results  Component Value Date   WBC 14.7 (H) 09/03/2017   HGB 10.4 (L) 09/03/2017   HCT 31.5 (L) 09/03/2017   MCV 77.8 (L) 09/03/2017   PLT 303 09/03/2017    Assessment / Plan: IOL at 38.5 [facility], early labor, cholelithiasis  Labor: s/p Cytotec x 1 dose at 0207; contracting well without intervention. Maternal position changes to facilitate fetal rotation, currently in knee chest, then to extreme lateral. Will start pitocin if UCs space, plan AROM if no cervical change at next check.  Maternal Bradycardia: d/w Dr Elesa MassedWard, pt asymptomatic, will get EKG and add TSH to prev lab draw Preeclampsia:  no e/o Pre-e.  Fetal Wellbeing:  Category I Pain Control:  Labor support without medications; desires epidural when needed, currently coping well with UCs.  I/D:  GBS Pos- PCN Anticipated MOD:  NSVD  Burak, Joanathan Affeldt A, CNM 09/03/2017, 7:55 AM

## 2017-09-03 NOTE — Anesthesia Procedure Notes (Signed)
Epidural Patient location during procedure: OB  Staffing Anesthesiologist: Adams, James G, MD Resident/CRNA: Teauna Dubach, CRNA Performed: resident/CRNA   Preanesthetic Checklist Completed: patient identified, site marked, surgical consent, pre-op evaluation, timeout performed, IV checked, risks and benefits discussed and monitors and equipment checked  Epidural Patient position: sitting Prep: ChloraPrep and site prepped and draped Patient monitoring: heart rate, continuous pulse ox and blood pressure Approach: midline Location: L4-L5 Injection technique: LOR saline  Needle:  Needle type: Tuohy  Needle gauge: 17 G Needle length: 9 cm and 9 Needle insertion depth: 8 cm Catheter type: closed end flexible Catheter size: 19 Gauge Catheter at skin depth: 13 cm Test dose: negative and 1.5% lidocaine with Epi 1:200 K  Assessment Events: blood not aspirated, injection not painful, no injection resistance, negative IV test and no paresthesia  Additional Notes   Patient tolerated the insertion well without complications.Reason for block:procedure for pain     

## 2017-09-03 NOTE — Plan of Care (Signed)
  Problem: Education: Goal: Knowledge of Childbirth will improve Outcome: Progressing Goal: Ability to make informed decisions regarding treatment and plan of care will improve Outcome: Progressing Goal: Ability to state and carry out methods to decrease the pain will improve Outcome: Progressing   Problem: Coping: Goal: Ability to verbalize concerns and feelings about labor and delivery will improve Outcome: Progressing   Problem: Life Cycle: Goal: Ability to make normal progression through stages of labor will improve Outcome: Progressing Goal: Ability to effectively push during vaginal delivery will improve Outcome: Progressing   Problem: Role Relationship: Goal: Ability to demonstrate positive interaction with the child will improve Outcome: Progressing   Problem: Safety: Goal: Risk of complications during labor and delivery will decrease Outcome: Progressing   Problem: Pain Management: Goal: Relief or control of pain from uterine contractions will improve Outcome: Progressing   Problem: Education: Goal: Knowledge of General Education information will improve Outcome: Progressing   Problem: Health Behavior/Discharge Planning: Goal: Ability to manage health-related needs will improve Outcome: Progressing   Problem: Clinical Measurements: Goal: Ability to maintain clinical measurements within normal limits will improve Outcome: Progressing Goal: Will remain free from infection Outcome: Progressing Goal: Diagnostic test results will improve Outcome: Progressing Goal: Respiratory complications will improve Outcome: Progressing Goal: Cardiovascular complication will be avoided Outcome: Progressing   Problem: Activity: Goal: Risk for activity intolerance will decrease Outcome: Progressing   Problem: Nutrition: Goal: Adequate nutrition will be maintained Outcome: Progressing   Problem: Coping: Goal: Level of anxiety will decrease Outcome: Progressing    Problem: Elimination: Goal: Will not experience complications related to bowel motility Outcome: Progressing Goal: Will not experience complications related to urinary retention Outcome: Progressing   Problem: Pain Managment: Goal: General experience of comfort will improve Outcome: Progressing   Problem: Safety: Goal: Ability to remain free from injury will improve Outcome: Progressing   Problem: Skin Integrity: Goal: Risk for impaired skin integrity will decrease Outcome: Progressing   

## 2017-09-03 NOTE — Anesthesia Preprocedure Evaluation (Signed)
Anesthesia Evaluation  Patient identified by MRN, date of birth, ID band Patient awake    Reviewed: Allergy & Precautions, H&P , NPO status , Patient's Chart, lab work & pertinent test results, reviewed documented beta blocker date and time   Airway Mallampati: II  TM Distance: >3 FB Neck ROM: full    Dental no notable dental hx. (+) Teeth Intact   Pulmonary neg pulmonary ROS, Current Smoker,    Pulmonary exam normal breath sounds clear to auscultation       Cardiovascular Exercise Tolerance: Good hypertension, negative cardio ROS   Rhythm:regular Rate:Normal     Neuro/Psych negative neurological ROS  negative psych ROS   GI/Hepatic negative GI ROS, Neg liver ROS,   Endo/Other  negative endocrine ROSdiabetes  Renal/GU      Musculoskeletal   Abdominal   Peds  Hematology negative hematology ROS (+) anemia ,   Anesthesia Other Findings   Reproductive/Obstetrics (+) Pregnancy                             Anesthesia Physical Anesthesia Plan  ASA: II  Anesthesia Plan: Epidural   Post-op Pain Management:    Induction:   PONV Risk Score and Plan:   Airway Management Planned:   Additional Equipment:   Intra-op Plan:   Post-operative Plan:   Informed Consent: I have reviewed the patients History and Physical, chart, labs and discussed the procedure including the risks, benefits and alternatives for the proposed anesthesia with the patient or authorized representative who has indicated his/her understanding and acceptance.     Plan Discussed with:   Anesthesia Plan Comments:         Anesthesia Quick Evaluation

## 2017-09-03 NOTE — Progress Notes (Signed)
Labor Progress Note  Morgan Mendoza is a 22 y.o. G1P0000 at 10142w5d by US at 25+5wks, presented to L&D [facility]   Subjective: comfortable after epidural.   Objective: BP (!) 115/52   Pulse (!) 55   Temp 97.7 F (36.5 C) (Oral)   Resp 18   Ht 5\' 9"  (1.753 m)   Wt 275 lb (124.7 kg)   LMP 12/10/2016   SpO2 100%   BMI 40.61 kg/m  Notable VS details: reviewed, EKG pending.   Fetal Assessment: FHT:  FHR: 135 bpm, variability: moderate,  accelerations:  Present,  decelerations:  Absent Category/reactivity:  Category I UC:   irregular, every 1-5 minutes with coupling SVE:   5/75/-1; SROM large amt clear fluid noted.   Labs: Lab Results  Component Value Date   WBC 14.7 (H) 09/03/2017   HGB 10.4 (L) 09/03/2017   HCT 31.5 (L) 09/03/2017   MCV 77.8 (L) 09/03/2017   PLT 303 09/03/2017    Assessment / Plan: [facility] 38.5 [facility]   Labor: s/p Cytotec x 1, UCs spacing out and coupling- will start Pitocin now. SROM after epidural placed.  Dr Elesa MassedWard updated on POC.  Maternal bradycardia- normal TSH, EKG pending.  Preeclampsia:  no e/o pre-e Fetal Wellbeing:  Category I Pain Control:  Epidural I/D:  PCN given x 3 doses now.  Anticipated MOD:  NSVD  Pair, REBECCA A, CNM 09/03/2017, 11:31 AM

## 2017-09-03 NOTE — Progress Notes (Signed)
Labor Progress Note  Morgan Mendoza is a 22 y.o. G1P0000 at 4121w5d by US at 25+5wks, presented to L&D [facility]   Subjective: Feels mild cramping sensation with UCs. Denies pelvic or rectal pressure.   Objective: BP 111/61   Pulse (!) 50   Temp 97.7 F (36.5 C) (Oral)   Resp 18   Ht 5\' 9"  (1.753 m)   Wt 275 lb (124.7 kg)   LMP 12/10/2016   SpO2 100%   BMI 40.61 kg/m  Notable VS details: reviewed  Fetal Assessment: FHT:  FHR: 145 bpm, variability: moderate,  accelerations:  Present,  decelerations:  Present early decels with UCs now, several late decels resolved with position change.  Category/reactivity:  Category I UC:   regular, every 1-5 minutes; MVUs: 275 Pitocin at 9614mu/min, continues to have some coupling.  SVE:   Dilation: 8 Effacement (%): 90 Cervical Position: Middle Station: -1 Presentation: Vertex Exam by:: Heloise Ochoaebecca Cuppett CNM Membrane status:SROM x 5hrs Amniotic color: clear  Labs: Lab Results  Component Value Date   WBC 14.7 (H) 09/03/2017   HGB 10.4 (L) 09/03/2017   HCT 31.5 (L) 09/03/2017   MCV 77.8 (L) 09/03/2017   PLT 303 09/03/2017    Assessment / Plan:  I[facility] at 38.5 [facility], [facility]; Active labor, FHR cat I tracing with early decels and moderate variability throughout. .   Labor: Progressing normally Preeclampsia:  no e/o Pre-E Fetal Wellbeing:  Category I Pain Control:  Epidural, used PCA dose x 1 I/D:  GBS prophy with PCN IVPB; adequately treated.  Anticipated MOD:  NSVD  Bourget, REBECCA A, CNM 09/03/2017, 3:26 PM

## 2017-09-04 ENCOUNTER — Inpatient Hospital Stay: Payer: BC Managed Care – PPO

## 2017-09-04 LAB — TYPE AND SCREEN
ABO/RH(D): A NEG
Antibody Screen: POSITIVE
UNIT DIVISION: 0
Unit division: 0

## 2017-09-04 LAB — CBC
HEMATOCRIT: 27.5 % — AB (ref 35.0–47.0)
HEMOGLOBIN: 9.2 g/dL — AB (ref 12.0–16.0)
MCH: 26.5 pg (ref 26.0–34.0)
MCHC: 33.6 g/dL (ref 32.0–36.0)
MCV: 78.8 fL — ABNORMAL LOW (ref 80.0–100.0)
Platelets: 224 10*3/uL (ref 150–440)
RBC: 3.49 MIL/uL — ABNORMAL LOW (ref 3.80–5.20)
RDW: 16.2 % — ABNORMAL HIGH (ref 11.5–14.5)
WBC: 12 10*3/uL — ABNORMAL HIGH (ref 3.6–11.0)

## 2017-09-04 LAB — FETAL SCREEN: Fetal Screen: NEGATIVE

## 2017-09-04 LAB — BPAM RBC
Blood Product Expiration Date: 201904282359
Blood Product Expiration Date: 201904282359
Unit Type and Rh: 600
Unit Type and Rh: 600

## 2017-09-04 LAB — RPR: RPR: NONREACTIVE

## 2017-09-04 MED ORDER — FERROUS SULFATE 325 (65 FE) MG PO TABS
325.0000 mg | ORAL_TABLET | Freq: Two times a day (BID) | ORAL | Status: DC
  Administered 2017-09-04 – 2017-09-05 (×3): 325 mg via ORAL
  Filled 2017-09-04 (×3): qty 1

## 2017-09-04 MED ORDER — RHO D IMMUNE GLOBULIN 1500 UNIT/2ML IJ SOSY
300.0000 ug | PREFILLED_SYRINGE | Freq: Once | INTRAMUSCULAR | Status: AC
  Administered 2017-09-04: 300 ug via INTRAMUSCULAR
  Filled 2017-09-04: qty 2

## 2017-09-04 NOTE — Progress Notes (Signed)
Post Partum Day 1  Subjective: Doing well, no complaints.  Tol pain with PO meds, voiding and ambulating without difficulty. C/O back/shoulder pain and RUQ tenderness. N/V x 2 overnight.   No CP SOB Fever,Chills, or leg pain; denies nipple or breast pain, no HA change of vision  Objective: BP (!) 103/56 (BP Location: Left Arm)   Pulse 60   Temp 98.1 F (36.7 C) (Oral)   Resp 18   Ht 5\' 9"  (1.753 m)   Wt 275 lb (124.7 kg)   LMP 12/10/2016   SpO2 98%   Breastfeeding? Unknown   BMI 40.61 kg/m    Physical Exam:  General: NAD Breasts: soft/nontender CV: RRR Pulm: nl effort, CTABL Abdomen: soft,  BS x 4; RUQ: TTP. Perineum: minimal edema, lacerations hemostatic and repair well approximated Lochia: moderate Uterine Fundus: fundus firm and 2 fb below umbilicus DVT Evaluation: no cords, ttp LEs   Recent Labs    09/03/17 0148 09/04/17 0616  HGB 10.4* 9.2*  HCT 31.5* 27.5*  WBC 14.7* 12.0*  PLT 303 224    Assessment/Plan: 22 y.o. G1P1001 postpartum day # 1  - Continue routine PP care - Lactation consult - RUQ pain with N/V ongoing, known cholelithiasis; D/w Dr Feliberto GottronSchermerhorn, will get RUQ US this am. CMP done yesterday- LFTs wnl.  - Acute blood loss anemia - hemodynamically stable and asymptomatic; start po ferrous sulfate BID with stool softeners  - Immunization status:  all Imms up to date    Disposition: Does not desire Dc home today.    Hedges, REBECCA A, CNM 09/04/2017 7:39 AM

## 2017-09-04 NOTE — Anesthesia Postprocedure Evaluation (Signed)
Anesthesia Post Note  Patient: Research scientist (life sciences)[name]  Procedure(s) Performed: AN AD HOC LABOR EPIDURAL  Patient location during evaluation: Mother Baby Anesthesia Type: Epidural Level of consciousness: awake and alert Pain management: pain level controlled Vital Signs Assessment: post-procedure vital signs reviewed and stable Respiratory status: spontaneous breathing, nonlabored ventilation and respiratory function stable Cardiovascular status: stable Postop Assessment: no headache, no backache and epidural receding Anesthetic complications: no     Last Vitals:  Vitals:   09/03/17 2319 09/04/17 0300  BP: 127/78 (!) 103/56  Pulse: 67 60  Resp: 20 18  Temp: 36.5 C 36.7 C  SpO2: 100% 98%    Last Pain:  Vitals:   09/04/17 0300  TempSrc: Oral  PainSc:                  Morgan Mendoza,  Palin Tristan M

## 2017-09-04 NOTE — Progress Notes (Signed)
Patient ID: Morgan Mendoza, female   DOB: 09-Aug-1995, 22 y.o.   MRN: [id number] Gallbladder scan done . + stones , not obstructing . No evidence of cholelithisis Reg diet   anticipate d/c in am   Rhogam today

## 2017-09-04 NOTE — Lactation Note (Signed)
This note was copied from a baby's chart. Lactation Consultation Note  Patient Name: Morgan Mendoza ZOXWR'UToday's Date: 09/04/2017 Reason for consult: Follow-up assessment;Mother's request   Maternal Data Has patient been taught Hand Expression?: Yes Does the patient have breastfeeding experience prior to this delivery?: No  Feeding Feeding Type: Breast Fed  LATCH Score Latch: Repeated attempts needed to sustain latch, nipple held in mouth throughout feeding, stimulation needed to elicit sucking reflex.  Audible Swallowing: A few with stimulation  Type of Nipple: Everted at rest and after stimulation  Comfort (Breast/Nipple): Soft / non-tender  Hold (Positioning): Assistance needed to correctly position infant at breast and maintain latch.  LATCH Score: 7  Interventions Interventions: Breast feeding basics reviewed;Assisted with latch;Breast compression;Position options;Support pillows  Lactation Tools Discussed/Used Tools: Nipple Shields Nipple shield size: 24   Consult Status Consult Status: Follow-up Date: 09/04/17 Follow-up type: In-patient  LC verified mom doesn't need the nipple shield for baby. Mom does have tubular breasts, could possibly reflect low supply in the future once milk transitions.   Burnadette PeterJaniya M Teresia Myint 09/04/2017, 11:57 AM

## 2017-09-05 LAB — RHOGAM INJECTION: Unit division: 0

## 2017-09-05 LAB — CBC
HCT: 30 % — ABNORMAL LOW (ref 35.0–47.0)
Hemoglobin: 9.8 g/dL — ABNORMAL LOW (ref 12.0–16.0)
MCH: 25.9 pg — AB (ref 26.0–34.0)
MCHC: 32.8 g/dL (ref 32.0–36.0)
MCV: 79 fL — AB (ref 80.0–100.0)
PLATELETS: 232 10*3/uL (ref 150–440)
RBC: 3.79 MIL/uL — AB (ref 3.80–5.20)
RDW: 16.7 % — ABNORMAL HIGH (ref 11.5–14.5)
WBC: 11.1 10*3/uL — ABNORMAL HIGH (ref 3.6–11.0)

## 2017-09-05 MED ORDER — FERROUS SULFATE 325 (65 FE) MG PO TBEC: 325.0000 mg | DELAYED_RELEASE_TABLET | Freq: Two times a day (BID) | ORAL | 3 refills | Status: DC

## 2017-09-05 MED ORDER — SENNOSIDES-DOCUSATE SODIUM 8.6-50 MG PO TABS: 2.0000 | ORAL_TABLET | Freq: Every evening | ORAL | Status: DC | PRN

## 2017-09-05 MED ORDER — IBUPROFEN 600 MG PO TABS: 600.0000 mg | ORAL_TABLET | Freq: Four times a day (QID) | ORAL | 0 refills | Status: DC

## 2017-09-05 NOTE — Discharge Instructions (Signed)

## 2017-09-05 NOTE — Progress Notes (Signed)
Discharge order received from doctor. Rhogam given prior to discharge. Reviewed discharge instructions and prescriptions with patient and answered all questions. Follow up appointment instructions given. Patient verbalized understanding. ID bands checked. Patient discharged home with infant via wheelchair by nursing/auxillary.    Oswald HillockAbigail Garner, RN

## 2017-09-14 ENCOUNTER — Encounter: Payer: Self-pay | Admitting: Emergency Medicine

## 2017-09-14 ENCOUNTER — Other Ambulatory Visit: Payer: Self-pay

## 2017-09-14 DIAGNOSIS — Z6836 Body mass index (BMI) 36.0-36.9, adult: Secondary | ICD-10-CM

## 2017-09-14 DIAGNOSIS — K8062 Calculus of gallbladder and bile duct with acute cholecystitis without obstruction: Principal | ICD-10-CM | POA: Diagnosis present

## 2017-09-14 DIAGNOSIS — D649 Anemia, unspecified: Secondary | ICD-10-CM | POA: Diagnosis present

## 2017-09-14 DIAGNOSIS — E876 Hypokalemia: Secondary | ICD-10-CM | POA: Diagnosis present

## 2017-09-14 DIAGNOSIS — Y848 Other medical procedures as the cause of abnormal reaction of the patient, or of later complication, without mention of misadventure at the time of the procedure: Secondary | ICD-10-CM | POA: Diagnosis present

## 2017-09-14 DIAGNOSIS — R001 Bradycardia, unspecified: Secondary | ICD-10-CM | POA: Diagnosis present

## 2017-09-14 DIAGNOSIS — I10 Essential (primary) hypertension: Secondary | ICD-10-CM | POA: Diagnosis present

## 2017-09-14 DIAGNOSIS — K8050 Calculus of bile duct without cholangitis or cholecystitis without obstruction: Secondary | ICD-10-CM | POA: Diagnosis not present

## 2017-09-14 DIAGNOSIS — K859 Acute pancreatitis without necrosis or infection, unspecified: Secondary | ICD-10-CM | POA: Diagnosis not present

## 2017-09-14 DIAGNOSIS — N390 Urinary tract infection, site not specified: Secondary | ICD-10-CM | POA: Diagnosis present

## 2017-09-14 DIAGNOSIS — E669 Obesity, unspecified: Secondary | ICD-10-CM | POA: Diagnosis present

## 2017-09-14 LAB — URINALYSIS, COMPLETE (UACMP) WITH MICROSCOPIC
Bacteria, UA: NONE SEEN
Bilirubin Urine: NEGATIVE
Glucose, UA: NEGATIVE mg/dL
Ketones, ur: NEGATIVE mg/dL
Nitrite: NEGATIVE
Protein, ur: 100 mg/dL — AB
SPECIFIC GRAVITY, URINE: 1.034 — AB (ref 1.005–1.030)
pH: 6 (ref 5.0–8.0)

## 2017-09-14 LAB — CBC
HCT: 36 % (ref 35.0–47.0)
Hemoglobin: 11.7 g/dL — ABNORMAL LOW (ref 12.0–16.0)
MCH: 24.9 pg — ABNORMAL LOW (ref 26.0–34.0)
MCHC: 32.4 g/dL (ref 32.0–36.0)
MCV: 76.9 fL — ABNORMAL LOW (ref 80.0–100.0)
Platelets: 377 10*3/uL (ref 150–440)
RBC: 4.68 MIL/uL (ref 3.80–5.20)
RDW: 15.9 % — ABNORMAL HIGH (ref 11.5–14.5)
WBC: 17.6 10*3/uL — ABNORMAL HIGH (ref 3.6–11.0)

## 2017-09-14 LAB — COMPREHENSIVE METABOLIC PANEL
ALBUMIN: 3.7 g/dL (ref 3.5–5.0)
ALK PHOS: 117 U/L (ref 38–126)
ALT: 39 U/L (ref 14–54)
AST: 41 U/L (ref 15–41)
Anion gap: 9 (ref 5–15)
BUN: 17 mg/dL (ref 6–20)
CALCIUM: 9.1 mg/dL (ref 8.9–10.3)
CO2: 26 mmol/L (ref 22–32)
CREATININE: 0.8 mg/dL (ref 0.44–1.00)
Chloride: 104 mmol/L (ref 101–111)
GFR calc Af Amer: >60 mL/min (ref >60)
GFR calc non Af Amer: >60 mL/min (ref >60)
GLUCOSE: 106 mg/dL — AB (ref 65–99)
Potassium: 3.7 mmol/L (ref 3.5–5.1)
SODIUM: 139 mmol/L (ref 135–145)
Total Bilirubin: 0.7 mg/dL (ref 0.3–1.2)
Total Protein: 8 g/dL (ref 6.5–8.1)

## 2017-09-14 NOTE — ED Triage Notes (Signed)
Patient ambulatory to triage with steady gait, without difficulty or distress noted; pt reports pain to right side/flank tonight accomp by N/V; vag delivery on 3/28 and currently breast feeding

## 2017-09-15 ENCOUNTER — Inpatient Hospital Stay: Payer: BC Managed Care – PPO

## 2017-09-15 ENCOUNTER — Inpatient Hospital Stay
Admission: EM | Admit: 2017-09-15 | Discharge: 2017-09-19 | DRG: 417 | Disposition: A | Discharge status: Home / Self Care | Payer: BC Managed Care – PPO | Attending: Family Medicine | Admitting: Family Medicine

## 2017-09-15 ENCOUNTER — Encounter
Admission: EM | Disposition: A | Discharge status: Home / Self Care | Payer: Self-pay | Source: Home / Self Care | Attending: Internal Medicine

## 2017-09-15 ENCOUNTER — Inpatient Hospital Stay: Payer: BC Managed Care – PPO | Admitting: Anesthesiology

## 2017-09-15 ENCOUNTER — Other Ambulatory Visit: Payer: Self-pay

## 2017-09-15 ENCOUNTER — Encounter: Payer: Self-pay | Admitting: Anesthesiology

## 2017-09-15 ENCOUNTER — Emergency Department: Payer: BC Managed Care – PPO

## 2017-09-15 DIAGNOSIS — K8050 Calculus of bile duct without cholangitis or cholecystitis without obstruction: Secondary | ICD-10-CM | POA: Diagnosis present

## 2017-09-15 DIAGNOSIS — Z6836 Body mass index (BMI) 36.0-36.9, adult: Secondary | ICD-10-CM | POA: Diagnosis not present

## 2017-09-15 DIAGNOSIS — R1011 Right upper quadrant pain: Secondary | ICD-10-CM

## 2017-09-15 DIAGNOSIS — N390 Urinary tract infection, site not specified: Secondary | ICD-10-CM | POA: Diagnosis present

## 2017-09-15 DIAGNOSIS — K8062 Calculus of gallbladder and bile duct with acute cholecystitis without obstruction: Secondary | ICD-10-CM | POA: Diagnosis present

## 2017-09-15 DIAGNOSIS — R1110 Vomiting, unspecified: Secondary | ICD-10-CM

## 2017-09-15 DIAGNOSIS — D649 Anemia, unspecified: Secondary | ICD-10-CM | POA: Diagnosis present

## 2017-09-15 DIAGNOSIS — R001 Bradycardia, unspecified: Secondary | ICD-10-CM | POA: Diagnosis present

## 2017-09-15 DIAGNOSIS — E669 Obesity, unspecified: Secondary | ICD-10-CM | POA: Diagnosis present

## 2017-09-15 DIAGNOSIS — K859 Acute pancreatitis without necrosis or infection, unspecified: Secondary | ICD-10-CM | POA: Diagnosis not present

## 2017-09-15 DIAGNOSIS — K9189 Other postprocedural complications and disorders of digestive system: Secondary | ICD-10-CM | POA: Diagnosis not present

## 2017-09-15 DIAGNOSIS — K8020 Calculus of gallbladder without cholecystitis without obstruction: Secondary | ICD-10-CM

## 2017-09-15 DIAGNOSIS — I10 Essential (primary) hypertension: Secondary | ICD-10-CM | POA: Diagnosis present

## 2017-09-15 DIAGNOSIS — E876 Hypokalemia: Secondary | ICD-10-CM | POA: Diagnosis present

## 2017-09-15 DIAGNOSIS — Y848 Other medical procedures as the cause of abnormal reaction of the patient, or of later complication, without mention of misadventure at the time of the procedure: Secondary | ICD-10-CM | POA: Diagnosis present

## 2017-09-15 HISTORY — PX: ENDOSCOPIC RETROGRADE CHOLANGIOPANCREATOGRAPHY (ERCP) WITH PROPOFOL: SHX5810

## 2017-09-15 LAB — HEMOGLOBIN A1C
Hgb A1c MFr Bld: 5 % (ref 4.8–5.6)
Mean Plasma Glucose: 96.8 mg/dL

## 2017-09-15 LAB — MRSA PCR SCREENING: MRSA by PCR: NEGATIVE

## 2017-09-15 LAB — LIPASE, BLOOD: LIPASE: 35 U/L (ref 11–51)

## 2017-09-15 LAB — TSH: TSH: 2.765 u[IU]/mL (ref 0.350–4.500)

## 2017-09-15 SURGERY — ENDOSCOPIC RETROGRADE CHOLANGIOPANCREATOGRAPHY (ERCP) WITH PROPOFOL
Anesthesia: General

## 2017-09-15 MED ORDER — ONDANSETRON HCL 4 MG PO TABS: 4.0000 mg | ORAL_TABLET | Freq: Four times a day (QID) | ORAL | Status: DC | PRN

## 2017-09-15 MED ORDER — CEFTRIAXONE SODIUM 1 G IJ SOLR
1.0000 g | INTRAMUSCULAR | Status: DC
Start: 2017-09-15 — End: 2017-09-19
  Administered 2017-09-15 – 2017-09-17 (×3): 1 g via INTRAVENOUS
  Filled 2017-09-15 (×5): qty 10

## 2017-09-15 MED ORDER — PIPERACILLIN-TAZOBACTAM 3.375 G IVPB
3.3750 g | Freq: Once | INTRAVENOUS | Status: AC
  Administered 2017-09-15: 3.375 g via INTRAVENOUS
  Filled 2017-09-15: qty 50

## 2017-09-15 MED ORDER — ONDANSETRON HCL 4 MG/2ML IJ SOLN
INTRAMUSCULAR | Status: AC
  Filled 2017-09-15: qty 2

## 2017-09-15 MED ORDER — LIDOCAINE HCL (CARDIAC) 20 MG/ML IV SOLN
INTRAVENOUS | Status: DC | PRN
  Administered 2017-09-15: 80 mg via INTRAVENOUS

## 2017-09-15 MED ORDER — FENTANYL CITRATE (PF) 100 MCG/2ML IJ SOLN
INTRAMUSCULAR | Status: DC | PRN
  Administered 2017-09-15: 50 ug via INTRAVENOUS

## 2017-09-15 MED ORDER — MIDAZOLAM HCL 2 MG/2ML IJ SOLN
INTRAMUSCULAR | Status: DC | PRN
  Administered 2017-09-15: 2 mg via INTRAVENOUS

## 2017-09-15 MED ORDER — ONDANSETRON HCL 4 MG/2ML IJ SOLN
INTRAMUSCULAR | Status: DC | PRN
  Administered 2017-09-15: 4 mg via INTRAVENOUS

## 2017-09-15 MED ORDER — ONDANSETRON HCL 4 MG/2ML IJ SOLN: 4.0000 mg | Freq: Once | INTRAMUSCULAR | Status: DC | PRN

## 2017-09-15 MED ORDER — PROPOFOL 500 MG/50ML IV EMUL
INTRAVENOUS | Status: DC | PRN
  Administered 2017-09-15: 150 ug/kg/min via INTRAVENOUS

## 2017-09-15 MED ORDER — ONDANSETRON HCL 4 MG/2ML IJ SOLN
4.0000 mg | Freq: Once | INTRAMUSCULAR | Status: AC
  Administered 2017-09-15: 4 mg via INTRAVENOUS
  Filled 2017-09-15: qty 2

## 2017-09-15 MED ORDER — ACETAMINOPHEN 325 MG PO TABS
650.0000 mg | ORAL_TABLET | Freq: Four times a day (QID) | ORAL | Status: DC | PRN
  Administered 2017-09-16 – 2017-09-18 (×2): 650 mg via ORAL
  Filled 2017-09-15 (×2): qty 2

## 2017-09-15 MED ORDER — FENTANYL CITRATE (PF) 100 MCG/2ML IJ SOLN
INTRAMUSCULAR | Status: AC
  Filled 2017-09-15: qty 2

## 2017-09-15 MED ORDER — PROPOFOL 10 MG/ML IV BOLUS
INTRAVENOUS | Status: AC
  Filled 2017-09-15: qty 20

## 2017-09-15 MED ORDER — HEPARIN SODIUM (PORCINE) 5000 UNIT/ML IJ SOLN
5000.0000 [IU] | Freq: Three times a day (TID) | INTRAMUSCULAR | Status: DC
  Administered 2017-09-15 – 2017-09-19 (×10): 5000 [IU] via SUBCUTANEOUS
  Filled 2017-09-15 (×15): qty 1

## 2017-09-15 MED ORDER — MIDAZOLAM HCL 2 MG/2ML IJ SOLN
INTRAMUSCULAR | Status: AC
  Filled 2017-09-15: qty 2

## 2017-09-15 MED ORDER — DOCUSATE SODIUM 100 MG PO CAPS
100.0000 mg | ORAL_CAPSULE | Freq: Two times a day (BID) | ORAL | Status: DC
  Administered 2017-09-16 – 2017-09-18 (×4): 100 mg via ORAL
  Filled 2017-09-15 (×6): qty 1

## 2017-09-15 MED ORDER — MORPHINE SULFATE (PF) 4 MG/ML IV SOLN
4.0000 mg | Freq: Once | INTRAVENOUS | Status: AC
  Administered 2017-09-15: 4 mg via INTRAVENOUS
  Filled 2017-09-15: qty 1

## 2017-09-15 MED ORDER — INDOMETHACIN 50 MG RE SUPP
100.0000 mg | Freq: Once | RECTAL | Status: AC
  Administered 2017-09-15: 100 mg via RECTAL

## 2017-09-15 MED ORDER — ONDANSETRON HCL 4 MG/2ML IJ SOLN
4.0000 mg | Freq: Four times a day (QID) | INTRAMUSCULAR | Status: DC | PRN
  Administered 2017-09-16 – 2017-09-18 (×2): 4 mg via INTRAVENOUS
  Filled 2017-09-15 (×2): qty 2

## 2017-09-15 MED ORDER — PROPOFOL 500 MG/50ML IV EMUL
INTRAVENOUS | Status: AC
  Filled 2017-09-15: qty 50

## 2017-09-15 MED ORDER — INDOMETHACIN 50 MG RE SUPP
RECTAL | Status: AC
  Administered 2017-09-15: 100 mg via RECTAL
  Filled 2017-09-15: qty 2

## 2017-09-15 MED ORDER — SODIUM CHLORIDE 0.9 % IV BOLUS
1000.0000 mL | Freq: Once | INTRAVENOUS | Status: AC
  Administered 2017-09-15: 1000 mL via INTRAVENOUS

## 2017-09-15 MED ORDER — SODIUM CHLORIDE 0.9 % IV SOLN
INTRAVENOUS | Status: DC
  Administered 2017-09-15: 06:00:00 via INTRAVENOUS
  Administered 2017-09-15: 1000 mL via INTRAVENOUS
  Administered 2017-09-15 – 2017-09-18 (×9): via INTRAVENOUS
  Administered 2017-09-18: 1000 mL via INTRAVENOUS
  Administered 2017-09-19: 05:00:00 via INTRAVENOUS

## 2017-09-15 MED ORDER — ACETAMINOPHEN 650 MG RE SUPP: 650.0000 mg | Freq: Four times a day (QID) | RECTAL | Status: DC | PRN

## 2017-09-15 MED ORDER — FENTANYL CITRATE (PF) 100 MCG/2ML IJ SOLN: 25.0000 ug | INTRAMUSCULAR | Status: DC | PRN

## 2017-09-15 NOTE — ED Notes (Signed)
Sherilyn CooterHenry RN called the lab to add the lipase lab to the already collected blood in the lab. Lab states the they will run it.

## 2017-09-15 NOTE — Anesthesia Post-op Follow-up Note (Signed)
Anesthesia QCDR form completed.        

## 2017-09-15 NOTE — Op Note (Signed)
[facility] Gastroenterology Patient Name: Morgan Mendoza Procedure Date: 09/15/2017 11:35 AM MRN: [id number] Account #: [id number] Date of Birth: [birth year] Admit Type: Inpatient Age: 22 Room: [facility] ENDO ROOM 4 Gender: Female Note Status: Finalized Procedure:            ERCP Indications:          Common bile duct stone(s) Providers:            Midge Minium MD, MD Medicines:            Propofol per Anesthesia Complications:        No immediate complications. Procedure:            Pre-Anesthesia Assessment:                       - Prior to the procedure, a History and Physical was                        performed, and patient medications and allergies were                        reviewed. The patient's tolerance of previous                        anesthesia was also reviewed. The risks and benefits of                        the procedure and the sedation options and risks were                        discussed with the patient. All questions were                        answered, and informed consent was obtained. Prior                        Anticoagulants: The patient has taken no previous                        anticoagulant or antiplatelet agents. ASA Grade                        Assessment: II - A patient with mild systemic disease.                        After reviewing the risks and benefits, the patient was                        deemed in satisfactory condition to undergo the                        procedure.                       After obtaining informed consent, the scope was passed                        under direct vision. Throughout the procedure, the                        patient's blood pressure, pulse,  and oxygen saturations                        were monitored continuously. The Endosonoscope was                        introduced through the mouth, and used to inject                        contrast into and used to cannulate the bile duct. The                  ERCP was accomplished without difficulty. The patient                        tolerated the procedure well. Findings:      The scout film was normal. The esophagus was successfully intubated       under direct vision. The scope was advanced to a normal major papilla in       the descending duodenum without detailed examination of the pharynx,       larynx and associated structures, and upper GI tract. The upper GI tract       was grossly normal. The bile duct was deeply cannulated with the       short-nosed traction sphincterotome. Contrast was injected. I personally       interpreted the bile duct images. There was brisk flow of contrast       through the ducts. Image quality was excellent. Contrast extended to the       entire biliary tree. The lower third of the main bile duct contained one       stone, which was 6 mm in diameter. A wire was passed into the biliary       tree. A 7 mm biliary sphincterotomy was made with a traction (standard)       sphincterotome using ERBE electrocautery. There was no       post-sphincterotomy bleeding. The biliary tree was swept with a 15 mm       balloon starting at the bifurcation. One stone was removed. No stones       remained. Impression:           - Choledocholithiasis was found. Complete removal was                        accomplished by biliary sphincterotomy and balloon                        extraction.                       - A biliary sphincterotomy was performed.                       - The biliary tree was swept. Recommendation:       - Return patient to hospital ward for ongoing care.                       - Clear liquid diet today. Procedure Code(s):    --- Professional ---                       [phone], Endoscopic retrograde cholangiopancreatography                        (  ERCP); with removal of calculi/debris from                        biliary/pancreatic duct(s)                       [phone], Endoscopic retrograde  cholangiopancreatography                        (ERCP); with sphincterotomy/papillotomy                       [phone], Endoscopic catheterization of the biliary ductal                        system, radiological supervision and interpretation Diagnosis Code(s):    --- Professional ---                       K80.50, Calculus of bile duct without cholangitis or                        cholecystitis without obstruction CPT copyright 2017 American Medical Association. All rights reserved. The codes documented in this report are preliminary and upon coder review may  be revised to meet current compliance requirements. Midge Minium MD, MD 09/15/2017 11:59:43 AM This report has been signed electronically. Number of Addenda: 0 Note Initiated On: 09/15/2017 11:35 AM      [facility]

## 2017-09-15 NOTE — ED Notes (Signed)
Pt to US via stretcher

## 2017-09-15 NOTE — Progress Notes (Signed)
The patient has no complaints of abdominal pain, nausea or vomiting or diarrhea.  Status post ERCP with stone removed this morning. Her vital signs are stable, physical examinations is unremarkable. GI physician Dr. Servando SnareWohl suggests surgical consult for cholecystectomy.  Follow-up with surgeon for surgery tomorrow.  Discussed with the patient and RN.  Time spent about 25 minutes.

## 2017-09-15 NOTE — ED Notes (Signed)
Patient transported to 229 

## 2017-09-15 NOTE — Anesthesia Preprocedure Evaluation (Signed)
Anesthesia Evaluation  Patient identified by MRN, date of birth, ID band Patient awake    Reviewed: Allergy & Precautions, NPO status , Patient's Chart, lab work & pertinent test results, reviewed documented beta blocker date and time   Airway Mallampati: III  TM Distance: >3 FB     Dental  (+) Chipped   Pulmonary           Cardiovascular hypertension, Pt. on medications      Neuro/Psych    GI/Hepatic   Endo/Other    Renal/GU      Musculoskeletal   Abdominal   Peds  Hematology  (+) anemia ,   Anesthesia Other Findings Obese. No UP needed.  Reproductive/Obstetrics                             Anesthesia Physical Anesthesia Plan  ASA: III  Anesthesia Plan: General   Post-op Pain Management:    Induction: Intravenous  PONV Risk Score and Plan:   Airway Management Planned:   Additional Equipment:   Intra-op Plan:   Post-operative Plan:   Informed Consent: I have reviewed the patients History and Physical, chart, labs and discussed the procedure including the risks, benefits and alternatives for the proposed anesthesia with the patient or authorized representative who has indicated his/her understanding and acceptance.     Plan Discussed with: CRNA  Anesthesia Plan Comments:         Anesthesia Quick Evaluation

## 2017-09-15 NOTE — Consult Note (Signed)
Patient ID: Morgan Mendoza, female   DOB: 05-Aug-1995, 22 y.o.   MRN: [id number]  HPI [name] is a 22 y.o. female seen in consultation at the request of Dr. Imogene Burnhen. She came to the hospital last night complaining of right upper quadrant pain radiating to the flank that was moderate in intensity associated with nausea and vomiting.  The patient reports that the pain was intermittent and sharp in nature.  She did have a similar episode in the past. No fevers no chills no evidence of biliary obstruction.  Previous major abdominal operations.  She is able to perform more than 4 metastases of activity without any shortness of breath or chest pain. Ultrasound personally review revealing an enlarged common bile duct with also gallstones.  There was concern for choledocholithiasis.  Her LFTs were normal.  White count was 17,000. She underwent an ERCP with sphincterotomy and removal of couple of stones within the common bile duct.  She feels well after her ERCP without any signs of pancreatitis or complications. She reports that she wants to have a cholecystectomy as soon as possible.  HPI  Past Medical History:  Diagnosis Date  . Anemia   . Obesity    BMI 42 on 09/19/2016    Past Surgical History:  Procedure Laterality Date  . MOUTH SURGERY     had tooth removed    Family History  Problem Relation Age of Onset  . Hypertension Mother        also has a benign brain tumor  . Muscular dystrophy Cousin   . Breast cancer Neg Hx   . Deep vein thrombosis Neg Hx     Social History Social History   Tobacco Use  . Smoking status: Never Smoker  . Smokeless tobacco: Never Used  Substance Use Topics  . Alcohol use: Not Currently    Comment: occasionally  . Drug use: No    Allergies  Allergen Reactions  . Other Hives and Itching    Strawberries, kiwi and oranges    Current Facility-Administered Medications  Medication Dose Route Frequency Provider Last Rate Last Dose  . 0.9 %  sodium  chloride infusion   Intravenous Continuous Midge MiniumWohl, Darren, MD 125 mL/hr at 09/15/17 1534    . acetaminophen (TYLENOL) tablet 650 mg  650 mg Oral Q6H PRN Midge MiniumWohl, Darren, MD       Or  . acetaminophen (TYLENOL) suppository 650 mg  650 mg Rectal Q6H PRN Midge MiniumWohl, Darren, MD      . cefTRIAXone (ROCEPHIN) 1 g in sodium chloride 0.9 % 100 mL IVPB  1 g Intravenous Q24H Shaune Pollackhen, Qing, MD 200 mL/hr at 09/15/17 1815 1 g at 09/15/17 1815  . docusate sodium (COLACE) capsule 100 mg  100 mg Oral BID Midge MiniumWohl, Darren, MD      . heparin injection 5,000 Units  5,000 Units Subcutaneous Q8H Midge MiniumWohl, Darren, MD   5,000 Units at 09/15/17 1350  . ondansetron (ZOFRAN) tablet 4 mg  4 mg Oral Q6H PRN Midge MiniumWohl, Darren, MD       Or  . ondansetron (ZOFRAN) injection 4 mg  4 mg Intravenous Q6H PRN Midge MiniumWohl, Darren, MD         Review of Systems Full ROS  was asked and was negative except for the information on the HPI  Physical Exam Blood pressure 127/79, pulse (!) 54, temperature 97.6 F (36.4 C), temperature source Oral, resp. rate 17, height 5\' 9"  (1.753 m), weight 113.4 kg (250 lb), SpO2 100 %, unknown if  currently breastfeeding. CONSTITUTIONAL:NAD EYES: Pupils are equal, round, and reactive to light, Sclera are non-icteric. EARS, NOSE, MOUTH AND THROAT: The oropharynx is clear. The oral mucosa is pink and moist. Hearing is intact to voice. LYMPH NODES:  Lymph nodes in the neck are normal. RESPIRATORY:  Lungs are clear. There is normal respiratory effort, with equal breath sounds bilaterally, and without pathologic use of accessory muscles. CARDIOVASCULAR: Heart is regular without murmurs, gallops, or rubs. GI: The abdomen is soft,Mildly TTP RUQ, no peritonitis There is no hepatosplenomegaly. There are normal bowel sounds in all quadrants. GU: Rectal deferred.   MUSCULOSKELETAL: Normal muscle strength and tone. No cyanosis or edema.   SKIN: Turgor is good and there are no pathologic skin lesions or ulcers. NEUROLOGIC: Motor and sensation  is grossly normal. Cranial nerves are grossly intact. PSYCH:  Oriented to person, place and time. Affect is normal.  Data Reviewed  I have personally reviewed the patient's imaging, laboratory findings and medical records.    Assessment/Plan 22 year old female with classic signs and symptoms consistent with acute cholecystitis and choledocholithiasis status post ERCP.  I do think that given her clinical situation he needs a cholecystectomy during this hospitalization. Currently she is exhibiting no signs of complications from an ERCP and I do think that is reasonable to perform a laparoscopic cholecystectomy. She Wants me to go ahead and do it as soon as possible and the earliest I can do it is tomorrow afternoon. The risks, benefits, complications, treatment options, and expected outcomes were discussed with the patient. The possibilities of bleeding, recurrent infection, finding a normal gallbladder, perforation of viscus organs, damage to surrounding structures, bile leak, abscess formation, needing a drain placed, the need for additional procedures, reaction to medication, pulmonary aspiration,  failure to diagnose a condition, the possible need to convert to an open procedure, and creating a complication requiring transfusion or operation were discussed with the patient. The patient and/or family concurred with the proposed plan, giving informed consent.    Sterling Big, MD FACS General Surgeon 09/15/2017, 6:42 PM

## 2017-09-15 NOTE — Anesthesia Postprocedure Evaluation (Signed)
Anesthesia Post Note  Patient: Morgan Mendoza  Procedure(s) Performed: ENDOSCOPIC RETROGRADE CHOLANGIOPANCREATOGRAPHY (ERCP) WITH PROPOFOL (N/A )  Patient location during evaluation: Endoscopy Anesthesia Type: General Level of consciousness: awake and alert Pain management: pain level controlled Vital Signs Assessment: post-procedure vital signs reviewed and stable Respiratory status: spontaneous breathing, nonlabored ventilation, respiratory function stable and patient connected to nasal cannula oxygen Cardiovascular status: blood pressure returned to baseline and stable Postop Assessment: no apparent nausea or vomiting Anesthetic complications: no     Last Vitals:  Vitals:   09/15/17 1228 09/15/17 1238  BP: 117/76 117/67  Pulse: (!) 43 (!) 42  Resp: 17 15  Temp:    SpO2: 100% 99%    Last Pain:  Vitals:   09/15/17 1208  TempSrc: Tympanic  PainSc: 0-No pain                 Mikya Don S

## 2017-09-15 NOTE — H&P (Signed)
Morgan Mendoza is an 22 y.o. female.   Chief Complaint: Abdominal pain HPI: The patient with past medical history of obesity presents to the emergency department complaining of abdominal pain that radiates to her right flank.  The patient has had similar complaints for approximately 1 year.  However, her nausea vomiting and pain have worsened acutely today.  Ultrasound of her abdomen revealed enlarged common bile duct concerning for choledocholithiasis.  The surgical service was consulted prior to the emergency department staff asking the hospitalist service to admit.  Past Medical History:  Diagnosis Date  . Anemia   . Obesity    BMI 42 on 09/19/2016    Past Surgical History:  Procedure Laterality Date  . MOUTH SURGERY     had tooth removed    Family History  Problem Relation Age of Onset  . Hypertension Mother        also has a benign brain tumor  . Muscular dystrophy Cousin   . Breast cancer Neg Hx   . Deep vein thrombosis Neg Hx    Social History:  reports that she has never smoked. She has never used smokeless tobacco. She reports that she drank alcohol. She reports that she does not use drugs.  Allergies:  Allergies  Allergen Reactions  . Other Hives and Itching    Strawberries, kiwi and oranges    Medications Prior to Admission  Medication Sig Dispense Refill  . ferrous sulfate 325 (65 FE) MG EC tablet Take 1 tablet (325 mg total) by mouth 2 (two) times daily with a meal.  3  . ibuprofen (ADVIL,MOTRIN) 600 MG tablet Take 1 tablet (600 mg total) by mouth every 6 (six) hours. 60 tablet 0  . prenatal vitamin w/FE, FA (PRENATAL 1 + 1) 27-1 MG TABS tablet Take 1 tablet by mouth daily at 12 noon.      Results for orders placed or performed during the hospital encounter of 09/15/17 (from the past 48 hour(s))  CBC     Status: Abnormal   Collection Time: 09/14/17 11:16 PM  Result Value Ref Range   WBC 17.6 (H) 3.6 - 11.0 K/uL   RBC 4.68 3.80 - 5.20 MIL/uL   Hemoglobin 11.7  (L) 12.0 - 16.0 g/dL   HCT 36.0 35.0 - 47.0 %   MCV 76.9 (L) 80.0 - 100.0 fL   MCH 24.9 (L) 26.0 - 34.0 pg   MCHC 32.4 32.0 - 36.0 g/dL   RDW 15.9 (H) 11.5 - 14.5 %   Platelets 377 150 - 440 K/uL    Comment: Performed at [facility], [address] [place], [place] [address]  Comprehensive metabolic panel     Status: Abnormal   Collection Time: 09/14/17 11:16 PM  Result Value Ref Range   Sodium 139 135 - 145 mmol/L   Potassium 3.7 3.5 - 5.1 mmol/L   Chloride 104 101 - 111 mmol/L   CO2 26 22 - 32 mmol/L   Glucose, Bld 106 (H) 65 - 99 mg/dL   BUN 17 6 - 20 mg/dL   Creatinine, Ser 0.80 0.44 - 1.00 mg/dL   Calcium 9.1 8.9 - 10.3 mg/dL   Total Protein 8.0 6.5 - 8.1 g/dL   Albumin 3.7 3.5 - 5.0 g/dL   AST 41 15 - 41 U/L   ALT 39 14 - 54 U/L   Alkaline Phosphatase 117 38 - 126 U/L   Total Bilirubin 0.7 0.3 - 1.2 mg/dL   GFR calc non Af Amer >60 >  60 mL/min   GFR calc Af Amer >60 >60 mL/min    Comment: (NOTE) The eGFR has been calculated using the CKD EPI equation. This calculation has not been validated in all clinical situations. eGFR's persistently <60 mL/min signify possible Chronic Kidney Disease.    Anion gap 9 5 - 15    Comment: Performed at [facility], [address] [place], [place] [address]  Urinalysis, Complete w Microscopic     Status: Abnormal   Collection Time: 09/14/17 11:16 PM  Result Value Ref Range   Color, Urine AMBER (A) YELLOW    Comment: BIOCHEMICALS MAY BE AFFECTED BY COLOR   APPearance CLOUDY (A) CLEAR   Specific Gravity, Urine 1.034 (H) 1.005 - 1.030   pH 6.0 5.0 - 8.0   Glucose, UA NEGATIVE NEGATIVE mg/dL   Hgb urine dipstick LARGE (A) NEGATIVE   Bilirubin Urine NEGATIVE NEGATIVE   Ketones, ur NEGATIVE NEGATIVE mg/dL   Protein, ur 100 (A) NEGATIVE mg/dL   Nitrite NEGATIVE NEGATIVE   Leukocytes, UA LARGE (A) NEGATIVE   RBC / HPF TOO NUMEROUS TO COUNT 0 - 5 RBC/hpf   WBC, UA TOO NUMEROUS TO COUNT 0 - 5 WBC/hpf   Bacteria, UA  NONE SEEN NONE SEEN   Squamous Epithelial / LPF 0-5 (A) NONE SEEN   Mucus PRESENT     Comment: Performed at [facility], [address] [place], [place] [address]  Lipase, blood     Status: None   Collection Time: 09/14/17 11:16 PM  Result Value Ref Range   Lipase 35 11 - 51 U/L    Comment: Performed at [facility], [address] [place], [place] [address]  TSH     Status: None   Collection Time: 09/14/17 11:16 PM  Result Value Ref Range   TSH 2.765 0.350 - 4.500 uIU/mL    Comment: Performed by a 3rd Generation assay with a functional sensitivity of <=0.01 uIU/mL. Performed at [facility], [address] [place], [place] [address]    US Abdomen Limited Ruq  Result Date: 09/15/2017 CLINICAL DATA:  Right upper quadrant pain and vomiting. EXAM: ULTRASOUND ABDOMEN LIMITED RIGHT UPPER QUADRANT COMPARISON:  Most recent ultrasound 09/04/2017 FINDINGS: Gallbladder: Mild gallbladder distention containing multiple layering stones and possible sludge. No gallbladder wall thickening. No pericholecystic fluid. No sonographic Murphy sign noted by sonographer. Common bile duct: Diameter: 7 mm, mildly dilated. Probable 6 mm stone in the distal common bile duct. Liver: No focal lesion identified. Within normal limits in parenchymal echogenicity. Possible mild central intrahepatic biliary ductal dilatation. Portal vein is patent on color Doppler imaging with normal direction of blood flow towards the liver. IMPRESSION: Cholelithiasis. Suspect choledocholithiasis with 6 mm stone tentatively identified in the distal common bile duct with mild proximal biliary dilatation. No gallbladder wall thickening or pericholecystic fluid. Electronically Signed   By: Jeb Levering M.D.   On: 09/15/2017 03:30    Review of Systems  Constitutional: Negative for chills and fever.  HENT: Negative for sore throat and tinnitus.   Eyes: Negative for blurred vision and redness.  Respiratory:  Negative for cough and shortness of breath.   Cardiovascular: Negative for chest pain, palpitations, orthopnea and PND.  Gastrointestinal: Positive for abdominal pain, nausea (chronic) and vomiting. Negative for diarrhea.  Genitourinary: Negative for dysuria, frequency and urgency.  Musculoskeletal: Negative for joint pain and myalgias.  Skin: Negative for rash.       No lesions  Neurological: Negative for speech  change, focal weakness and weakness.  Endo/Heme/Allergies: Does not bruise/bleed easily.       No temperature intolerance  Psychiatric/Behavioral: Negative for depression and suicidal ideas.    Blood pressure 102/60, pulse (!) 48, temperature (!) 97.4 F (36.3 C), temperature source Oral, resp. rate 18, height _0  (1.753 m), weight 113.4 kg (250 lb), SpO2 100 %, unknown if currently breastfeeding. Physical Exam  Vitals reviewed. Constitutional: She is oriented to person, place, and time. She appears well-developed and well-nourished. No distress.  HENT:  Head: Normocephalic and atraumatic.  Mouth/Throat: Oropharynx is clear and moist.  Eyes: Pupils are equal, round, and reactive to light. Conjunctivae and EOM are normal. No scleral icterus.  Neck: Normal range of motion. Neck supple. No JVD present. No tracheal deviation present. No thyromegaly present.  Cardiovascular: Normal rate, regular rhythm and normal heart sounds. Exam reveals no gallop and no friction rub.  No murmur heard. Respiratory: Effort normal and breath sounds normal.  GI: Soft. Bowel sounds are normal. She exhibits no distension and no mass. There is tenderness. There is no rebound and no guarding.  Genitourinary:  Genitourinary Comments: Deferred  Musculoskeletal: Normal range of motion. She exhibits no edema.  Lymphadenopathy:    She has no cervical adenopathy.  Neurological: She is alert and oriented to person, place, and time. No cranial nerve deficit. She exhibits normal muscle tone.  Skin: Skin is  warm and dry. No rash noted. No erythema.  Psychiatric: She has a normal mood and affect. Her behavior is normal. Judgment and thought content normal.     Assessment/Plan This is a 22 year old female admitted for choledocholithiasis. 1.  Choledocholithiasis: Consult gastroenterology for ERCP.  Manage pain.  Zosyn for antibiotic coverage.  The patient is currently n.p.o. 2.  UTI: Present on admission: Covered by above antibiotics.  Recently postpartum 3.  Abdominal pain: None intractable; with nausea and vomiting.  Continue IV morphine for pain relief. 4.  Obesity: BMI 36.9; encouraged healthy diet and exercise. 5.  DVT prophylaxis: Heparin 6.  GI prophylaxis: None The patient is a full code.  Time spent on admission orders and patient care proximally 45 minutes  Harrie Foreman, MD 09/15/2017, 8:01 AM

## 2017-09-15 NOTE — Consult Note (Addendum)
Morgan Darby, MD [address]  [place]  [place], [place] [address]  Main: [phone]  Fax: [phone] Pager: [phone]   Consultation  Referring Provider:     No ref. provider found Primary Care Physician:  System, Pcp Not In Primary Gastroenterologist: none         Reason for Consultation:     choledocholithiasis  Date of Admission:  09/15/2017 Date of Consultation:  09/15/2017         HPI:   Morgan Mendoza is a 22 y.o. female with history of obesity, cholelithiasis presents with acute onset of right upper quadrant pain radiating to right flank associated with nausea and vomiting. She had similar episode in the past. She had an ultrasound in the ER which revealed dilated CBD with stones in the distal common bile duct. Interestingly, she had normal LFTs, but with significant leukocytosis. She recently delivered and currently breast-feeding. GI is consulted for ERCP. She underwent ERCP by Dr. Allen Norris today and a stone was removed from the common bile duct. Patient reports mild RUQ/epigastric discomfort but much less compared to the onset. She is able to tolerate clears.  NSAIDs: none  Antiplts/Anticoagulants/Anti thrombotics: none  GI Procedures: none  Past Medical History:  Diagnosis Date  . Anemia   . Obesity    BMI 42 on 09/19/2016    Past Surgical History:  Procedure Laterality Date  . MOUTH SURGERY     had tooth removed    Prior to Admission medications   Medication Sig Start Date End Date Taking? Authorizing Provider  ferrous sulfate 325 (65 FE) MG EC tablet Take 1 tablet (325 mg total) by mouth 2 (two) times daily with a meal. 09/05/17  Yes Lisette Grinder, CNM  ibuprofen (ADVIL,MOTRIN) 600 MG tablet Take 1 tablet (600 mg total) by mouth every 6 (six) hours. 09/05/17  Yes Lisette Grinder, CNM  prenatal vitamin w/FE, FA (PRENATAL 1 + 1) 27-1 MG TABS tablet Take 1 tablet by mouth daily at 12 noon.   Yes [provider]    Family History    Problem Relation Age of Onset  . Hypertension Mother        also has a benign brain tumor  . Muscular dystrophy Cousin   . Breast cancer Neg Hx   . Deep vein thrombosis Neg Hx      Social History   Tobacco Use  . Smoking status: Never Smoker  . Smokeless tobacco: Never Used  Substance Use Topics  . Alcohol use: Not Currently    Comment: occasionally  . Drug use: No    Allergies as of 09/14/2017 - Review Complete 09/14/2017  Allergen Reaction Noted  . Other  09/19/2016    Review of Systems:    All systems reviewed and negative except where noted in HPI.   Physical Exam:  Vital signs in last 24 hours: Temp:  [97.1 F (36.2 C)-98.4 F (36.9 C)] 97.6 F (36.4 C) (04/09 1500) Pulse Rate:  [42-80] 54 (04/09 1500) Resp:  [13-20] 17 (04/09 1500) BP: (96-151)/(52-99) 127/79 (04/09 1500) SpO2:  [98 %-100 %] 100 % (04/09 1500) Weight:  [250 lb (113.4 kg)] 250 lb (113.4 kg) (04/08 2312) Last BM Date: 09/14/17 General:   Pleasant, cooperative in NAD Head:  Normocephalic and atraumatic. Eyes:   No icterus.   Conjunctiva pink. PERRLA. Ears:  Normal auditory acuity. Neck:  Supple; no masses or thyroidomegaly Lungs: Respirations even and unlabored. Lungs clear to auscultation bilaterally.  No wheezes, crackles, or rhonchi.  Heart:  Regular rate and rhythm;  Without murmur, clicks, rubs or gallops Abdomen:  Soft, nondistended, mild RUQ tenderness. Normal bowel sounds. No appreciable masses or hepatomegaly.  No rebound or guarding.  Rectal:  Not performed. Msk:  Symmetrical without gross deformities.  Strength normal  Extremities:  Without edema, cyanosis or clubbing. Neurologic:  Alert and oriented x3;  grossly normal neurologically. Skin:  Intact without significant lesions or rashes. Cervical Nodes:  No significant cervical adenopathy. Psych:  Alert and cooperative. Normal affect.  LAB RESULTS: CBC Latest Ref Rng & Units 09/14/2017 09/05/2017 09/04/2017  WBC 3.6 - 11.0 K/uL  17.6(H) 11.1(H) 12.0(H)  Hemoglobin 12.0 - 16.0 g/dL 11.7(L) 9.8(L) 9.2(L)  Hematocrit 35.0 - 47.0 % 36.0 30.0(L) 27.5(L)  Platelets 150 - 440 K/uL 377 232 224    BMET BMP Latest Ref Rng & Units 09/14/2017 09/03/2017  Glucose 65 - 99 mg/dL 106(H) 87  BUN 6 - 20 mg/dL 17 9  Creatinine 0.44 - 1.00 mg/dL 0.80 0.55  Sodium 135 - 145 mmol/L 139 138  Potassium 3.5 - 5.1 mmol/L 3.7 4.0  Chloride 101 - 111 mmol/L 104 109  CO2 22 - 32 mmol/L 26 19(L)  Calcium 8.9 - 10.3 mg/dL 9.1 8.2(L)    LFT Hepatic Function Latest Ref Rng & Units 09/14/2017 09/03/2017  Total Protein 6.5 - 8.1 g/dL 8.0 6.6  Albumin 3.5 - 5.0 g/dL 3.7 2.7(L)  AST 15 - 41 U/L 41 30  ALT 14 - 54 U/L 39 29  Alk Phosphatase 38 - 126 U/L 117 129(H)  Total Bilirubin 0.3 - 1.2 mg/dL 0.7 0.4     STUDIES: Dg C-arm 1-60 Min-no Report  Result Date: 09/15/2017 Fluoroscopy was utilized by the requesting physician.  No radiographic interpretation.   US Abdomen Limited Ruq  Result Date: 09/15/2017 CLINICAL DATA:  Right upper quadrant pain and vomiting. EXAM: ULTRASOUND ABDOMEN LIMITED RIGHT UPPER QUADRANT COMPARISON:  Most recent ultrasound 09/04/2017 FINDINGS: Gallbladder: Mild gallbladder distention containing multiple layering stones and possible sludge. No gallbladder wall thickening. No pericholecystic fluid. No sonographic Murphy sign noted by sonographer. Common bile duct: Diameter: 7 mm, mildly dilated. Probable 6 mm stone in the distal common bile duct. Liver: No focal lesion identified. Within normal limits in parenchymal echogenicity. Possible mild central intrahepatic biliary ductal dilatation. Portal vein is patent on color Doppler imaging with normal direction of blood flow towards the liver. IMPRESSION: Cholelithiasis. Suspect choledocholithiasis with 6 mm stone tentatively identified in the distal common bile duct with mild proximal biliary dilatation. No gallbladder wall thickening or pericholecystic fluid. Electronically  Signed   By: Jeb Levering M.D.   On: 09/15/2017 03:30      Impression / Plan:   Morgan Mendoza is a 22 y.o. female with obesity, history of cholelithiasis presents with choledocholithiasis. There is no evidence of gallstone pancreatitis. Patient underwent ERCP with removal of stone from common bile duct. LFTs have been normal - Post ERCP check tomorrow - LFTs tomorrow - Surgery consult for cholecystectomy   Thank you for involving me in the care of this patient.      LOS: 0 days   Sherri Sear, MD  09/15/2017, 6:11 PM   Note: This dictation was prepared with Dragon dictation along with smaller phrase technology. Any transcriptional errors that result from this process are unintentional.

## 2017-09-15 NOTE — Transfer of Care (Signed)
Immediate Anesthesia Transfer of Care Note  Patient: Morgan Mendoza  Procedure(s) Performed: ENDOSCOPIC RETROGRADE CHOLANGIOPANCREATOGRAPHY (ERCP) WITH PROPOFOL (N/A )  Patient Location: PACU and Endoscopy Unit  Anesthesia Type:General  Level of Consciousness: awake  Airway & Oxygen Therapy: Patient Spontanous Breathing  Post-op Assessment: Report given to RN  Post vital signs: stable  Last Vitals:  Vitals Value Taken Time  BP    Temp    Pulse    Resp    SpO2      Last Pain:  Vitals:   09/15/17 1044  TempSrc: Oral  PainSc:          Complications: No apparent anesthesia complications

## 2017-09-15 NOTE — Anesthesia Preprocedure Evaluation (Addendum)
Anesthesia Evaluation  Patient identified by MRN, date of birth, ID band Patient awake    Reviewed: Allergy & Precautions, NPO status , Patient's Chart, lab work & pertinent test results, reviewed documented beta blocker date and time   Airway Mallampati: II  TM Distance: >3 FB     Dental  (+) Chipped   Pulmonary           Cardiovascular hypertension, Pt. on medications      Neuro/Psych    GI/Hepatic   Endo/Other    Renal/GU      Musculoskeletal   Abdominal   Peds  Hematology  (+) anemia ,   Anesthesia Other Findings Past Medical History: No date: Anemia No date: Obesity     Comment:  BMI 42 on 09/19/2016 had a baby 2 weeks ago No sex since then.   Reproductive/Obstetrics                           Anesthesia Physical Anesthesia Plan  ASA: II  Anesthesia Plan: General   Post-op Pain Management:    Induction: Intravenous  PONV Risk Score and Plan:   Airway Management Planned: Oral ETT  Additional Equipment:   Intra-op Plan:   Post-operative Plan:   Informed Consent: I have reviewed the patients History and Physical, chart, labs and discussed the procedure including the risks, benefits and alternatives for the proposed anesthesia with the patient or authorized representative who has indicated his/her understanding and acceptance.     Plan Discussed with: CRNA  Anesthesia Plan Comments:         Anesthesia Quick Evaluation

## 2017-09-15 NOTE — ED Notes (Signed)
Pt ambulated to the bedside commode to void and returned to her bed without difficulty.  

## 2017-09-15 NOTE — ED Provider Notes (Signed)
[facility] Emergency Department Provider Note   First MD Initiated Contact with Patient 09/15/17 [phone]     (approximate)  I have reviewed the triage vital signs and the nursing notes.   HISTORY  Chief Complaint Flank Pain    HPI Morgan Mendoza is a 22 y.o. female with below list of chronic medical conditions including vaginal delivery on 09/03/17, Cholelithiasis presents to the emergency department with acute onset of right upper quadrant/right flank pain accompanied by vomiting which began yesterday.Patient state current pain score is 10 out of 10. Patient denies any fever  Past Medical History:  Diagnosis Date  . Anemia   . Obesity    BMI 42 on 09/19/2016    Patient Active Problem List   Diagnosis Date Noted  . Gestational hypertension 09/03/2017  . Right upper quadrant abdominal pain 09/30/2016  . Contraceptive management 09/19/2016    Past Surgical History:  Procedure Laterality Date  . MOUTH SURGERY     had tooth removed    Prior to Admission medications   Medication Sig Start Date End Date Taking? Authorizing Provider  ferrous sulfate 325 (65 FE) MG EC tablet Take 1 tablet (325 mg total) by mouth 2 (two) times daily with a meal. 09/05/17   Genia DelHaviland, Margaret, CNM  ibuprofen (ADVIL,MOTRIN) 600 MG tablet Take 1 tablet (600 mg total) by mouth every 6 (six) hours. 09/05/17   Genia DelHaviland, Margaret, CNM  prenatal vitamin w/FE, FA (PRENATAL 1 + 1) 27-1 MG TABS tablet Take 1 tablet by mouth daily at 12 noon.    [provider]  senna-docusate (SENOKOT-S) 8.6-50 MG tablet Take 2 tablets by mouth at bedtime as needed for mild constipation. 09/05/17   Genia DelHaviland, Margaret, CNM    Allergies Other  Family History  Problem Relation Age of Onset  . Hypertension Mother        also has a benign brain tumor  . Muscular dystrophy Cousin   . Breast cancer Neg Hx   . Deep vein thrombosis Neg Hx     Social History Social History   Tobacco Use  .  Smoking status: Never Smoker  . Smokeless tobacco: Never Used  Substance Use Topics  . Alcohol use: Not Currently    Comment: occasionally  . Drug use: No    Review of Systems Constitutional: No fever/chills Eyes: No visual changes. ENT: No sore throat. Cardiovascular: Denies chest pain. Respiratory: Denies shortness of breath. Gastrointestinal: Positive for abdominal pain. Positive for vomiting. No diarrhea.  No constipation. Genitourinary: Negative for dysuria. Musculoskeletal: Negative for neck pain.  Negative for back pain. Integumentary: Negative for rash. Neurological: Negative for headaches, focal weakness or numbness.   ____________________________________________   PHYSICAL EXAM:  VITAL SIGNS: ED Triage Vitals [09/14/17 2312]  Enc Vitals Group     BP (!) 151/59     Pulse Rate 61     Resp 20     Temp 98.1 F (36.7 C)     Temp Source Oral     SpO2 99 %     Weight 113.4 kg (250 lb)     Height 1.753 m (5\' 9" )     Head Circumference      Peak Flow      Pain Score 10     Pain Loc      Pain Edu?      Excl. in GC?     Constitutional: Alert and oriented. Well appearing and in no acute distress. Eyes: Conjunctivae are normal.  Head: Atraumatic. Mouth/Throat: Mucous membranes are moist.  Oropharynx non-erythematous. Neck: No stridor.   Cardiovascular: Normal rate, regular rhythm. Good peripheral circulation. Grossly normal heart sounds. Respiratory: Normal respiratory effort.  No retractions. Lungs CTAB. Gastrointestinal: Right upper quadrant tenderness palpation.. No distention.  Musculoskeletal: No lower extremity tenderness nor edema. No gross deformities of extremities. Neurologic:  Normal speech and language. No gross focal neurologic deficits are appreciated.  Skin:  Skin is warm, dry and intact. No rash noted. Psychiatric: Mood and affect are normal. Speech and behavior are normal.  ____________________________________________   LABS (all labs  ordered are listed, but only abnormal results are displayed)  Labs Reviewed  CBC - Abnormal; Notable for the following components:      Result Value   WBC 17.6 (*)    Hemoglobin 11.7 (*)    MCV 76.9 (*)    MCH 24.9 (*)    RDW 15.9 (*)    All other components within normal limits  COMPREHENSIVE METABOLIC PANEL - Abnormal; Notable for the following components:   Glucose, Bld 106 (*)    All other components within normal limits  URINALYSIS, COMPLETE (UACMP) WITH MICROSCOPIC - Abnormal; Notable for the following components:   Color, Urine AMBER (*)    APPearance CLOUDY (*)    Specific Gravity, Urine 1.034 (*)    Hgb urine dipstick LARGE (*)    Protein, ur 100 (*)    Leukocytes, UA LARGE (*)    Squamous Epithelial / LPF 0-5 (*)    All other components within normal limits  URINE CULTURE  LIPASE, BLOOD   _________________________  RADIOLOGY I, Toston N Eidan Muellner, personally viewed and evaluated these images (plain radiographs) as part of my medical decision making, as well as reviewing the written report by the radiologist.  ED MD interpretation: Cholelithiasis with choledocholithiasis per radiologist.  Official radiology report(s): US Abdomen Limited Ruq  Result Date: 09/15/2017 CLINICAL DATA:  Right upper quadrant pain and vomiting. EXAM: ULTRASOUND ABDOMEN LIMITED RIGHT UPPER QUADRANT COMPARISON:  Most recent ultrasound 09/04/2017 FINDINGS: Gallbladder: Mild gallbladder distention containing multiple layering stones and possible sludge. No gallbladder wall thickening. No pericholecystic fluid. No sonographic Murphy sign noted by sonographer. Common bile duct: Diameter: 7 mm, mildly dilated. Probable 6 mm stone in the distal common bile duct. Liver: No focal lesion identified. Within normal limits in parenchymal echogenicity. Possible mild central intrahepatic biliary ductal dilatation. Portal vein is patent on color Doppler imaging with normal direction of blood flow towards the liver.  IMPRESSION: Cholelithiasis. Suspect choledocholithiasis with 6 mm stone tentatively identified in the distal common bile duct with mild proximal biliary dilatation. No gallbladder wall thickening or pericholecystic fluid. Electronically Signed   By: Rubye Oaks M.D.   On: 09/15/2017 03:30     Procedures   ____________________________________________   INITIAL IMPRESSION / ASSESSMENT AND PLAN / ED COURSE  As part of my medical decision making, I reviewed the following data within the electronic MEDICAL RECORD NUMBER   22 year old female presented with above-stated history and physical exam secondary to right upper quadrant abdominal pain with suspicion for cholelithiasis versus cholecystitis versus choledocholithiasis and as such ultrasound was performed which revealed no evidence of cholelithiasis and choledocholithiasis.  Patient was given IV morphine with improvement of pain however pain persisted this time.  Patient was also given Zofran with resolution of vomiting.  Laboratory data notable for a white blood cell count of 17.6.  Liver enzymes normal.  Patient discussed with Dr. Excell Seltzer general surgeon on-call who  recommended medicine admission with GI consultation and surgical consultation given choledocholithiasis.  As such patient discussed with Dr. Sheryle Hail for hospital admission further evaluation and management.    ____________________________________________  FINAL CLINICAL IMPRESSION(S) / ED DIAGNOSES  Final diagnoses:  Vomiting  RUQ pain  Calculus of gallbladder without cholecystitis without obstruction  Choledocholithiasis     MEDICATIONS GIVEN DURING THIS VISIT:  Medications  morphine 4 MG/ML injection 4 mg (4 mg Intravenous Given 09/15/17 0114)  ondansetron (ZOFRAN) injection 4 mg (4 mg Intravenous Given 09/15/17 0114)     ED Discharge Orders    None       Note:  This document was prepared using Dragon voice recognition software and may include unintentional  dictation errors.    Darci Current, MD 09/15/17 [phone]

## 2017-09-16 DIAGNOSIS — K859 Acute pancreatitis without necrosis or infection, unspecified: Secondary | ICD-10-CM

## 2017-09-16 DIAGNOSIS — K9189 Other postprocedural complications and disorders of digestive system: Secondary | ICD-10-CM

## 2017-09-16 LAB — CBC
HEMATOCRIT: 32.6 % — AB (ref 35.0–47.0)
HEMOGLOBIN: 10.5 g/dL — AB (ref 12.0–16.0)
MCH: 25.3 pg — AB (ref 26.0–34.0)
MCHC: 32.3 g/dL (ref 32.0–36.0)
MCV: 78.2 fL — AB (ref 80.0–100.0)
PLATELETS: 300 10*3/uL (ref 150–440)
RBC: 4.17 MIL/uL (ref 3.80–5.20)
RDW: 16 % — ABNORMAL HIGH (ref 11.5–14.5)
WBC: 12.7 10*3/uL — AB (ref 3.6–11.0)

## 2017-09-16 LAB — BASIC METABOLIC PANEL
ANION GAP: 6 (ref 5–15)
BUN: 7 mg/dL (ref 6–20)
CALCIUM: 8.3 mg/dL — AB (ref 8.9–10.3)
CO2: 22 mmol/L (ref 22–32)
Chloride: 112 mmol/L — ABNORMAL HIGH (ref 101–111)
Creatinine, Ser: 0.74 mg/dL (ref 0.44–1.00)
GFR calc non Af Amer: >60 mL/min (ref >60)
GLUCOSE: 82 mg/dL (ref 65–99)
POTASSIUM: 3.9 mmol/L (ref 3.5–5.1)
Sodium: 140 mmol/L (ref 135–145)

## 2017-09-16 LAB — HEPATIC FUNCTION PANEL
ALBUMIN: 2.9 g/dL — AB (ref 3.5–5.0)
ALT: 32 U/L (ref 14–54)
AST: 21 U/L (ref 15–41)
Alkaline Phosphatase: 97 U/L (ref 38–126)
BILIRUBIN TOTAL: 0.3 mg/dL (ref 0.3–1.2)
Bilirubin, Direct: <0.1 mg/dL — ABNORMAL LOW (ref 0.1–0.5)
TOTAL PROTEIN: 6.5 g/dL (ref 6.5–8.1)

## 2017-09-16 LAB — LIPASE, BLOOD: Lipase: 571 U/L — ABNORMAL HIGH (ref 11–51)

## 2017-09-16 LAB — URINE CULTURE

## 2017-09-16 LAB — TSH: TSH: 1.573 u[IU]/mL (ref 0.350–4.500)

## 2017-09-16 MED ORDER — HYDROMORPHONE HCL 1 MG/ML IJ SOLN
0.5000 mg | Freq: Once | INTRAMUSCULAR | Status: AC
Start: 2017-09-16 — End: 2017-09-16
  Administered 2017-09-16: 0.5 mg via INTRAVENOUS
  Filled 2017-09-16: qty 0.5

## 2017-09-16 MED ORDER — OXYCODONE-ACETAMINOPHEN 5-325 MG PO TABS
1.0000 | ORAL_TABLET | ORAL | Status: DC | PRN
  Administered 2017-09-16 – 2017-09-18 (×5): 1 via ORAL
  Filled 2017-09-16 (×2): qty 1
  Filled 2017-09-16: qty 2
  Filled 2017-09-16 (×2): qty 1

## 2017-09-16 MED ORDER — MORPHINE SULFATE (PF) 2 MG/ML IV SOLN
2.0000 mg | INTRAVENOUS | Status: DC | PRN
  Administered 2017-09-16 (×3): 2 mg via INTRAVENOUS
  Filled 2017-09-16 (×3): qty 1

## 2017-09-16 MED ORDER — MORPHINE SULFATE (PF) 4 MG/ML IV SOLN
4.0000 mg | INTRAVENOUS | Status: DC | PRN
Start: 2017-09-16 — End: 2017-09-19
  Administered 2017-09-16 – 2017-09-18 (×2): 4 mg via INTRAVENOUS
  Filled 2017-09-16 (×2): qty 1

## 2017-09-16 NOTE — Progress Notes (Signed)
Called Dr. Sheryle Hailiamond regarding pain medication.  Appropriate orders were placed.  Arturo MortonClay, Marquasha Brutus N  09/16/2017 3:22 AM

## 2017-09-16 NOTE — Progress Notes (Signed)
Called Dr. Sheryle Hailiamond regarding pain medication per patient request.  Appropriate orders were placed.  Arturo MortonClay, Rainn Bullinger N  09/16/2017  5:23 AM

## 2017-09-16 NOTE — Progress Notes (Addendum)
   R , MD [address]  [address]  [place], [place] [address]  Main: [phone]  Fax: [phone] Pager: [phone]   Subjective: Had 1 episode of emesis this morning and experiencing severe epigastric pain in addition to right upper quadrant pain. She is scheduled for cholecystectomy at 2 PM today. Lipase is elevated, LFTs are essentially normal  Objective: Vital signs in last 24 hours: Vitals:   09/15/17 1959 09/16/17 0415 09/16/17 0500 09/16/17 1230  BP: 112/74 111/74  126/65  Pulse: (!) 50 (!) 47  (!) 52  Resp: 18     Temp: 98.1 F (36.7 C) 98 F (36.7 C)  98.9 F (37.2 C)  TempSrc: Oral Oral  Oral  SpO2: 100% 99%  100%  Weight:   275 lb (124.7 kg)   Height:       Weight change: 25 lb (11.3 kg)  Intake/Output Summary (Last 24 hours) at 09/16/2017 1328 Last data filed at 09/16/2017 1021 Gross per 24 hour  Intake 554 ml  Output 2900 ml  Net -2346 ml     Exam: Heart:: bradycardia, regular rhythm Lungs: clear to auscultation Abdomen: moderate epigastric tenderness, with voluntary guarding, soft  Lab Results: CBC Latest Ref Rng & Units 09/16/2017 09/14/2017 09/05/2017  WBC 3.6 - 11.0 K/uL 12.7(H) 17.6(H) 11.1(H)  Hemoglobin 12.0 - 16.0 g/dL 10.5(L) 11.7(L) 9.8(L)  Hematocrit 35.0 - 47.0 % 32.6(L) 36.0 30.0(L)  Platelets 150 - 440 K/uL 300 377 232   BMP Latest Ref Rng & Units 09/14/2017 09/03/2017  Glucose 65 - 99 mg/dL 106(H) 87  BUN 6 - 20 mg/dL 17 9  Creatinine 0.44 - 1.00 mg/dL 0.80 0.55  Sodium 135 - 145 mmol/L 139 138  Potassium 3.5 - 5.1 mmol/L 3.7 4.0  Chloride 101 - 111 mmol/L 104 109  CO2 22 - 32 mmol/L 26 19(L)  Calcium 8.9 - 10.3 mg/dL 9.1 8.2(L)   Hepatic Function Latest Ref Rng & Units 09/16/2017 09/14/2017 09/03/2017  Total Protein 6.5 - 8.1 g/dL 6.5 8.0 6.6  Albumin 3.5 - 5.0 g/dL 2.9(L) 3.7 2.7(L)  AST 15 - 41 U/L 21 41 30  ALT 14 - 54 U/L 32 39 29  Alk Phosphatase 38 - 126 U/L 97 117 129(H)  Total Bilirubin 0.3 - 1.2 mg/dL  0.3 0.7 0.4  Bilirubin, Direct 0.1 - 0.5 mg/dL <0.1(L) - -   Micro Results: Recent Results (from the past 240 hour(s))  MRSA PCR Screening     Status: None   Collection Time: 09/15/17 10:16 AM  Result Value Ref Range Status   MRSA by PCR NEGATIVE NEGATIVE Final    Comment:        The GeneXpert MRSA Assay (FDA approved for NASAL specimens only), is one component of a comprehensive MRSA colonization surveillance program. It is not intended to diagnose MRSA infection nor to guide or monitor treatment for MRSA infections. Performed at [facility], [address] [place], [place] [address]    Studies/Results: Dg C-arm 1-60 Min-no Report  Result Date: 09/15/2017 Fluoroscopy was utilized by the requesting physician.  No radiographic interpretation.   Us Abdomen Limited Ruq  Result Date: 09/15/2017 CLINICAL DATA:  Right upper quadrant pain and vomiting. EXAM: ULTRASOUND ABDOMEN LIMITED RIGHT UPPER QUADRANT COMPARISON:  Most recent ultrasound 09/04/2017 FINDINGS: Gallbladder: Mild gallbladder distention containing multiple layering stones and possible sludge. No gallbladder wall thickening. No pericholecystic fluid. No sonographic Murphy sign noted by sonographer. Common bile duct: Diameter: 7 mm, mildly dilated. Probable   6 mm stone in the distal common bile duct. Liver: No focal lesion identified. Within normal limits in parenchymal echogenicity. Possible mild central intrahepatic biliary ductal dilatation. Portal vein is patent on color Doppler imaging with normal direction of blood flow towards the liver. IMPRESSION: Cholelithiasis. Suspect choledocholithiasis with 6 mm stone tentatively identified in the distal common bile duct with mild proximal biliary dilatation. No gallbladder wall thickening or pericholecystic fluid. Electronically Signed   By: Melanie  Ehinger M.D.   On: 09/15/2017 03:30   Medications: I have reviewed the patient's current medications. Scheduled Meds: .  docusate sodium  100 mg Oral BID  . heparin  5,000 Units Subcutaneous Q8H   Continuous Infusions: . sodium chloride 125 mL/hr at 09/16/17 0831  . cefTRIAXone (ROCEPHIN)  IV Stopped (09/15/17 1906)   PRN Meds:.acetaminophen **OR** acetaminophen, morphine injection, ondansetron **OR** ondansetron (ZOFRAN) IV   Assessment: Active Problems:   Choledocholithiasis  With new onset of epigastric pain, nonbloody emesis and elevated lipase post ERCP She has mild acute post-ERCP pancreatitis  Plan: - NPO - Continue IV fluids - Okay to proceed with cholecystectomy from GI standpoint   LOS: 1 day     09/16/2017, 1:28 PM 

## 2017-09-16 NOTE — Progress Notes (Signed)
CC: Cholecystitis w choledocho Subjective: S/P ERCP now more pain today, I ordered a Lipase and it was elevated. Some nausea  Objective: Vital signs in last 24 hours: Temp:  [98 F (36.7 C)-98.9 F (37.2 C)] 98.2 F (36.8 C) (04/10 1409) Pulse Rate:  [47-56] 56 (04/10 1409) Resp:  [18] 18 (04/09 1959) BP: (111-137)/(65-74) 137/72 (04/10 1409) SpO2:  [99 %-100 %] 100 % (04/10 1409) Weight:  [124.7 kg (275 lb)] 124.7 kg (275 lb) (04/10 0500) Last BM Date: 09/15/17  Intake/Output from previous day: 04/09 0701 - 04/10 0700 In: 1389 [I.V.:1387; IV Piggyback:2] Out: 2500 [Urine:2500] Intake/Output this shift: Total I/O In: -  Out: 800 [Urine:800]  Physical exam: NAD Abd: soft Tender epigastric area, no peritonitis Ext: no edema  Lab Results: CBC  Recent Labs    09/14/17 2316 09/16/17 0533  WBC 17.6* 12.7*  HGB 11.7* 10.5*  HCT 36.0 32.6*  PLT 377 300   BMET Recent Labs    09/14/17 2316 09/16/17 1347  NA 139 140  K 3.7 3.9  CL 104 112*  CO2 26 22  GLUCOSE 106* 82  BUN 17 7  CREATININE 0.80 0.74  CALCIUM 9.1 8.3*   PT/INR No results for input(s): LABPROT, INR in the last 72 hours. ABG No results for input(s): PHART, HCO3 in the last 72 hours.  Invalid input(s): PCO2, PO2  Studies/Results: Dg C-arm 1-60 Min-no Report  Result Date: 09/15/2017 Fluoroscopy was utilized by the requesting physician.  No radiographic interpretation.   [place] Abdomen Limited Ruq  Result Date: 09/15/2017 CLINICAL DATA:  Right upper quadrant pain and vomiting. EXAM: ULTRASOUND ABDOMEN LIMITED RIGHT UPPER QUADRANT COMPARISON:  Most recent ultrasound 09/04/2017 FINDINGS: Gallbladder: Mild gallbladder distention containing multiple layering stones and possible sludge. No gallbladder wall thickening. No pericholecystic fluid. No sonographic Murphy sign noted by sonographer. Common bile duct: Diameter: 7 mm, mildly dilated. Probable 6 mm stone in the distal common bile duct. Liver: No focal  lesion identified. Within normal limits in parenchymal echogenicity. Possible mild central intrahepatic biliary ductal dilatation. Portal vein is patent on color Doppler imaging with normal direction of blood flow towards the liver. IMPRESSION: Cholelithiasis. Suspect choledocholithiasis with 6 mm stone tentatively identified in the distal common bile duct with mild proximal biliary dilatation. No gallbladder wall thickening or pericholecystic fluid. Electronically Signed   By: Rubye OaksMelanie  Ehinger M.D.   On: 09/15/2017 03:30    Anti-infectives: Anti-infectives (From admission, onward)   Start     Dose/Rate Route Frequency Ordered Stop   09/15/17 1800  cefTRIAXone (ROCEPHIN) 1 g in sodium chloride 0.9 % 100 mL IVPB     1 g 200 mL/hr over 30 Minutes Intravenous Every 24 hours 09/15/17 1545     09/15/17 0915  piperacillin-tazobactam (ZOSYN) IVPB 3.375 g     3.375 g 12.5 mL/hr over 240 Minutes Intravenous  Once 09/15/17 0855 09/15/17 1400      Assessment/Plan:  Pancreatitis after ERCP, I would prefer to wait until we see improvement of her pancreatitis before surgical intervention. May do clears and recheck lipase daily. IF she improves we may do chole on Friday or the weekend.  I have spent more than 35 min in this encounter w > 50% spent in coordination and counseling  Sterling Bigiego Pabon, MD, [facility]  09/16/2017

## 2017-09-16 NOTE — Progress Notes (Addendum)
[facility] [place] at [facility]   PATIENT NAME: Morgan ChapmanSavana Mendoza    MR#:  [id number]  DATE OF BIRTH:  11-Aug-1995  SUBJECTIVE:  CHIEF COMPLAINT:  Pt is reporting abd pain, no dizziness  REVIEW OF SYSTEMS:  CONSTITUTIONAL: No fever, fatigue or weakness.  EYES: No blurred or double vision.  EARS, NOSE, AND THROAT: No tinnitus or ear pain.  RESPIRATORY: No cough, shortness of breath, wheezing or hemoptysis.  CARDIOVASCULAR: No chest pain, orthopnea, edema.  GASTROINTESTINAL: No nausea, vomiting, diarrhea , reports epigastric abdominal pain.  GENITOURINARY: No dysuria, hematuria.  ENDOCRINE: No polyuria, nocturia,  HEMATOLOGY: No anemia, easy bruising or bleeding SKIN: No rash or lesion. MUSCULOSKELETAL: No joint pain or arthritis.   NEUROLOGIC: No tingling, numbness, weakness.  PSYCHIATRY: No anxiety or depression.   DRUG ALLERGIES:   Allergies  Allergen Reactions  . Other Hives and Itching    Strawberries, kiwi and oranges    VITALS:  Blood pressure 111/74, pulse (!) 47, temperature 98 F (36.7 C), temperature source Oral, resp. rate 18, height 5\' 9"  (1.753 m), weight 124.7 kg (275 lb), SpO2 99 %, unknown if currently breastfeeding.  PHYSICAL EXAMINATION:  GENERAL:  22 y.o.-year-old patient lying in the bed with no acute distress.  EYES: Pupils equal, round, reactive to light and accommodation. No scleral icterus. Extraocular muscles intact.  HEENT: Head atraumatic, normocephalic. Oropharynx and nasopharynx clear.  NECK:  Supple, no jugular venous distention. No thyroid enlargement, no tenderness.  LUNGS: Normal breath sounds bilaterally, no wheezing, rales,rhonchi or crepitation. No use of accessory muscles of respiration.  CARDIOVASCULAR: S1, S2 normal. No murmurs, rubs, or gallops.  ABDOMEN: Soft, tender, nondistended. Bowel sounds present.  EXTREMITIES: No pedal edema, cyanosis, or clubbing.  NEUROLOGIC: Cranial nerves II through XII are intact.  Muscle strength 5/5 in all extremities. Sensation intact. Gait not checked.  PSYCHIATRIC: The patient is alert and oriented x 3.  SKIN: No obvious rash, lesion, or ulcer.    LABORATORY PANEL:   CBC Recent Labs  Lab 09/16/17 0533  WBC 12.7*  HGB 10.5*  HCT 32.6*  PLT 300   ------------------------------------------------------------------------------------------------------------------  Chemistries  Recent Labs  Lab 09/14/17 2316 09/16/17 0533  NA 139  --   K 3.7  --   CL 104  --   CO2 26  --   GLUCOSE 106*  --   BUN 17  --   CREATININE 0.80  --   CALCIUM 9.1  --   AST 41 21  ALT 39 32  ALKPHOS 117 97  BILITOT 0.7 0.3   ------------------------------------------------------------------------------------------------------------------  Cardiac Enzymes No results for input(s): TROPONINI in the last 168 hours. ------------------------------------------------------------------------------------------------------------------  RADIOLOGY:  Dg C-arm 1-60 Min-no Report  Result Date: 09/15/2017 Fluoroscopy was utilized by the requesting physician.  No radiographic interpretation.   [place] Abdomen Limited Ruq  Result Date: 09/15/2017 CLINICAL DATA:  Right upper quadrant pain and vomiting. EXAM: ULTRASOUND ABDOMEN LIMITED RIGHT UPPER QUADRANT COMPARISON:  Most recent ultrasound 09/04/2017 FINDINGS: Gallbladder: Mild gallbladder distention containing multiple layering stones and possible sludge. No gallbladder wall thickening. No pericholecystic fluid. No sonographic Murphy sign noted by sonographer. Common bile duct: Diameter: 7 mm, mildly dilated. Probable 6 mm stone in the distal common bile duct. Liver: No focal lesion identified. Within normal limits in parenchymal echogenicity. Possible mild central intrahepatic biliary ductal dilatation. Portal vein is patent on color Doppler imaging with normal direction of blood flow towards the liver. IMPRESSION: Cholelithiasis. Suspect  choledocholithiasis with 6  mm stone tentatively identified in the distal common bile duct with mild proximal biliary dilatation. No gallbladder wall thickening or pericholecystic fluid. Electronically Signed   By: Rubye Oaks M.D.   On: 09/15/2017 03:30    EKG:   Orders placed or performed during the hospital encounter of 09/15/17  . EKG 12-Lead  . EKG 12-Lead    ASSESSMENT AND PLAN:   This is a 22 year old female admitted for choledocholithiasis.   1.  Choledocholithiasis:  Seen by gastroenterology status post ERCP stone removed N.p.o. for cholecystectomy today by general surgery Check lipase to rule out ERCP pancreatitis . Pain management as needed  2.  UTI: Present on admission:  Urine cultures pending continue IV Rocephin  Recently postpartum  3.  Abdominal pain:intractable;  Continue IV morphine for pain relief. Check lipase today  4.  Sinus bradycardia: Patient is a symptomatic.  EKG with sinus bradycardia Check potassium and TSH  discussed with cardiology Dr. Mariah Milling curbside.  Okay to proceed with surgery  5.  DVT prophylaxis: Heparin        All the records are reviewed and case discussed with Care Management/Social Workerr. Management plans discussed with the patient, family and they are in agreement.  CODE STATUS: fc  TOTAL TIME TAKING CARE OF THIS PATIENT: 35 minutes.   POSSIBLE D/C IN 1-2  DAYS, DEPENDING ON CLINICAL CONDITION.  Note: This dictation was prepared with Dragon dictation along with smaller phrase technology. Any transcriptional errors that result from this process are unintentional.   Ramonita Lab M.D on 09/16/2017 at 12:13 PM  Between 7am to 6pm - Pager - [phone] After 6pm go to [email or web address] - password [email or web address]  [facility]  Office  [phone]  CC: Primary care physician; System, Pcp Not In

## 2017-09-17 ENCOUNTER — Encounter: Payer: Self-pay | Admitting: Gastroenterology

## 2017-09-17 DIAGNOSIS — K8020 Calculus of gallbladder without cholecystitis without obstruction: Secondary | ICD-10-CM

## 2017-09-17 LAB — COMPREHENSIVE METABOLIC PANEL
ALT: 21 U/L (ref 14–54)
ANION GAP: 6 (ref 5–15)
AST: 15 U/L (ref 15–41)
Albumin: 2.7 g/dL — ABNORMAL LOW (ref 3.5–5.0)
Alkaline Phosphatase: 80 U/L (ref 38–126)
BUN: 5 mg/dL — ABNORMAL LOW (ref 6–20)
CHLORIDE: 109 mmol/L (ref 101–111)
CO2: 22 mmol/L (ref 22–32)
Calcium: 7.8 mg/dL — ABNORMAL LOW (ref 8.9–10.3)
Creatinine, Ser: 0.69 mg/dL (ref 0.44–1.00)
GFR calc Af Amer: >60 mL/min (ref >60)
GFR calc non Af Amer: >60 mL/min (ref >60)
GLUCOSE: 94 mg/dL (ref 65–99)
POTASSIUM: 3.1 mmol/L — AB (ref 3.5–5.1)
Sodium: 137 mmol/L (ref 135–145)
Total Bilirubin: 0.3 mg/dL (ref 0.3–1.2)
Total Protein: 6 g/dL — ABNORMAL LOW (ref 6.5–8.1)

## 2017-09-17 LAB — CBC
HEMATOCRIT: 29.7 % — AB (ref 35.0–47.0)
Hemoglobin: 10.1 g/dL — ABNORMAL LOW (ref 12.0–16.0)
MCH: 26.5 pg (ref 26.0–34.0)
MCHC: 34.1 g/dL (ref 32.0–36.0)
MCV: 77.7 fL — AB (ref 80.0–100.0)
Platelets: 248 10*3/uL (ref 150–440)
RBC: 3.83 MIL/uL (ref 3.80–5.20)
RDW: 15.7 % — ABNORMAL HIGH (ref 11.5–14.5)
WBC: 10.5 10*3/uL (ref 3.6–11.0)

## 2017-09-17 LAB — LIPASE, BLOOD: Lipase: 151 U/L — ABNORMAL HIGH (ref 11–51)

## 2017-09-17 MED ORDER — POTASSIUM CHLORIDE CRYS ER 20 MEQ PO TBCR
40.0000 meq | EXTENDED_RELEASE_TABLET | ORAL | Status: AC
  Administered 2017-09-17: 40 meq via ORAL
  Filled 2017-09-17: qty 2

## 2017-09-17 MED ORDER — POTASSIUM CHLORIDE CRYS ER 20 MEQ PO TBCR
40.0000 meq | EXTENDED_RELEASE_TABLET | Freq: Once | ORAL | Status: AC
  Administered 2017-09-17: 40 meq via ORAL
  Filled 2017-09-17: qty 2

## 2017-09-17 NOTE — Progress Notes (Signed)
[facility] [place] [facility]   PATIENT NAME: Morgan ChapmanSavana Mendoza    MR#:  [id number]  DATE OF BIRTH:  [birth year]  SUBJECTIVE:  CHIEF COMPLAINT:  Pt is resting comfortably.  No abdominal pain.  No other complaints.  Mom at bedside.  Patient is 2 weeks postpartum  REVIEW OF SYSTEMS:  CONSTITUTIONAL: No fever, fatigue or weakness.  EYES: No blurred or double vision.  EARS, NOSE, AND THROAT: No tinnitus or ear pain.  RESPIRATORY: No cough, shortness of breath, wheezing or hemoptysis.  CARDIOVASCULAR: No chest pain, orthopnea, edema.  GASTROINTESTINAL: No nausea, vomiting, diarrhea ,  denies  abdominal pain.  GENITOURINARY: No dysuria, hematuria.  ENDOCRINE: No polyuria, nocturia,  HEMATOLOGY: No anemia, easy bruising or bleeding SKIN: No rash or lesion. MUSCULOSKELETAL: No joint pain or arthritis.   NEUROLOGIC: No tingling, numbness, weakness.  PSYCHIATRY: No anxiety or depression.   DRUG ALLERGIES:   Allergies  Allergen Reactions  . Other Hives and Itching    Strawberries, kiwi and oranges    VITALS:  Blood pressure (!) 104/56, pulse 78, temperature 99.2 F (37.3 C), temperature source Oral, resp. rate 18, height 5\' 9"  (1.753 m), weight 124.7 kg (275 lb), SpO2 99 %, unknown if currently breastfeeding.  PHYSICAL EXAMINATION:  GENERAL:  22 y.o.-year-old patient lying in the bed with no acute distress.  EYES: Pupils equal, round, reactive to light and accommodation. No scleral icterus. Extraocular muscles intact.  HEENT: Head atraumatic, normocephalic. Oropharynx and nasopharynx clear.  NECK:  Supple, no jugular venous distention. No thyroid enlargement, no tenderness.  LUNGS: Normal breath sounds bilaterally, no wheezing, rales,rhonchi or crepitation. No use of accessory muscles of respiration.  CARDIOVASCULAR: S1, S2 normal. No murmurs, rubs, or gallops.  ABDOMEN: Soft,  nontender, nondistended. Bowel sounds present.  EXTREMITIES: No pedal edema,  cyanosis, or clubbing.  NEUROLOGIC: Cranial nerves II through XII are intact. Muscle strength 5/5 in all extremities. Sensation intact. Gait not checked.  PSYCHIATRIC: The patient is alert and oriented x 3.  SKIN: No obvious rash, lesion, or ulcer.    LABORATORY PANEL:   CBC Recent Labs  Lab 09/17/17 0619  WBC 10.5  HGB 10.1*  HCT 29.7*  PLT 248   ------------------------------------------------------------------------------------------------------------------  Chemistries  Recent Labs  Lab 09/17/17 0619  NA 137  K 3.1*  CL 109  CO2 22  GLUCOSE 94  BUN 5*  CREATININE 0.69  CALCIUM 7.8*  AST 15  ALT 21  ALKPHOS 80  BILITOT 0.3   ------------------------------------------------------------------------------------------------------------------  Cardiac Enzymes No results for input(s): TROPONINI in the last 168 hours. ------------------------------------------------------------------------------------------------------------------  RADIOLOGY:  No results found.  EKG:   Orders placed or performed during the hospital encounter of 09/15/17  . EKG 12-Lead  . EKG 12-Lead  . EKG 12-Lead  . EKG 12-Lead    ASSESSMENT AND PLAN:   This is a 22 year old female admitted for choledocholithiasis.   1.  CholedocholithiasisWith post ERCP pancreatitis Seen by gastroenterology status post ERCP stone removed N.p.o. after midnight for cholecystectomy  tomorrow by general surgery Lipase is trending down 571-151  patient is clinically feeling better .Pain management as needed  2.  UTI: Present on admission:  Urine cultures pending continue IV Rocephin  Recently postpartum  3.  Abdominal pain:intractable;  Continue IV morphine for pain relief. Abdominal pain from post ERCP pancreatitis Clinically feeling much better  4.  Sinus bradycardia: Patient is a symptomatic.  EKG with sinus bradycardia nml TSH  discussed with cardiology Dr. Mariah MillingGollan  curbside.  Okay to proceed  with surgery  5. Hypokalemia Potassium supplements  5.  DVT prophylaxis: Heparin        All the records are reviewed and case discussed with Care Management/Social Workerr. Management plans discussed with the patient, family and they are in agreement.  CODE STATUS: fc  TOTAL TIME TAKING CARE OF THIS PATIENT: 35 minutes.   POSSIBLE D/C IN 1-2  DAYS, DEPENDING ON CLINICAL CONDITION.  Note: This dictation was prepared with Dragon dictation along with smaller phrase technology. Any transcriptional errors that result from this process are unintentional.   Ramonita Lab M.D on 09/17/2017 at 3:39 PM  Between 7am to 6pm - Pager - [phone] After 6pm go to [email or web address] - password [email or web address]  [facility]  Office  [phone]  CC: Primary care physician; System, Pcp Not In

## 2017-09-17 NOTE — Progress Notes (Signed)
CC: Pancreatitis after ERCP Subjective: Doing much better, lipase improving Taking clears  Objective: Vital signs in last 24 hours: Temp:  [98.4 F (36.9 C)-100.3 F (37.9 C)] 98.6 F (37 C) (04/11 1915) Pulse Rate:  [63-96] 96 (04/11 1915) Resp:  [18-20] 18 (04/11 1915) BP: (104-126)/(43-73) 115/53 (04/11 1915) SpO2:  [98 %-100 %] 100 % (04/11 1915) Last BM Date: 09/14/17  Intake/Output from previous day: 04/10 0701 - 04/11 0700 In: -  Out: 1800 [Urine:1800] Intake/Output this shift: No intake/output data recorded.  Physical exam:  NAd, Abd: soft, MIld ttp epigastric area, no peritonitis Ext: well perfused and no edema  Lab Results: CBC  Recent Labs    09/16/17 0533 09/17/17 0619  WBC 12.7* 10.5  HGB 10.5* 10.1*  HCT 32.6* 29.7*  PLT 300 248   BMET Recent Labs    09/16/17 1347 09/17/17 0619  NA 140 137  K 3.9 3.1*  CL 112* 109  CO2 22 22  GLUCOSE 82 94  BUN 7 5*  CREATININE 0.74 0.69  CALCIUM 8.3* 7.8*   PT/INR No results for input(s): LABPROT, INR in the last 72 hours. ABG No results for input(s): PHART, HCO3 in the last 72 hours.  Invalid input(s): PCO2, PO2  Studies/Results: No results found.  Anti-infectives: Anti-infectives (From admission, onward)   Start     Dose/Rate Route Frequency Ordered Stop   09/15/17 1800  cefTRIAXone (ROCEPHIN) 1 g in sodium chloride 0.9 % 100 mL IVPB     1 g 200 mL/hr over 30 Minutes Intravenous Every 24 hours 09/15/17 1545     09/15/17 0915  piperacillin-tazobactam (ZOSYN) IVPB 3.375 g     3.375 g 12.5 mL/hr over 240 Minutes Intravenous  Once 09/15/17 0855 09/15/17 1400      Assessment/Plan: L;ap chole in am Resolving pancreatitis  The risks, benefits, complications, treatment options, and expected outcomes were discussed with the patient. The possibilities of bleeding, recurrent infection, finding a normal gallbladder, perforation of viscus organs, damage to surrounding structures, bile leak, abscess  formation, needing a drain placed, the need for additional procedures, reaction to medication, pulmonary aspiration,  failure to diagnose a condition, the possible need to convert to an open procedure, and creating a complication requiring transfusion or operation were discussed with the patient. The patient and/or family concurred with the proposed plan, giving informed consent.  Sterling Bigiego Pabon, MD, [facility]  09/17/2017

## 2017-09-18 ENCOUNTER — Encounter: Payer: Self-pay | Admitting: *Deleted

## 2017-09-18 ENCOUNTER — Encounter
Admission: EM | Disposition: A | Discharge status: Home / Self Care | Payer: Self-pay | Source: Home / Self Care | Attending: Internal Medicine

## 2017-09-18 ENCOUNTER — Inpatient Hospital Stay: Payer: BC Managed Care – PPO | Admitting: Anesthesiology

## 2017-09-18 HISTORY — PX: CHOLECYSTECTOMY: SHX55

## 2017-09-18 LAB — BASIC METABOLIC PANEL
Anion gap: 5 (ref 5–15)
CALCIUM: 8 mg/dL — AB (ref 8.9–10.3)
CO2: 22 mmol/L (ref 22–32)
CREATININE: 0.64 mg/dL (ref 0.44–1.00)
Chloride: 112 mmol/L — ABNORMAL HIGH (ref 101–111)
GFR calc non Af Amer: >60 mL/min (ref >60)
Glucose, Bld: 94 mg/dL (ref 65–99)
Potassium: 3.3 mmol/L — ABNORMAL LOW (ref 3.5–5.1)
SODIUM: 139 mmol/L (ref 135–145)

## 2017-09-18 LAB — CBC
HCT: 28.7 % — ABNORMAL LOW (ref 35.0–47.0)
Hemoglobin: 9.9 g/dL — ABNORMAL LOW (ref 12.0–16.0)
MCH: 26.6 pg (ref 26.0–34.0)
MCHC: 34.6 g/dL (ref 32.0–36.0)
MCV: 76.8 fL — ABNORMAL LOW (ref 80.0–100.0)
PLATELETS: 245 10*3/uL (ref 150–440)
RBC: 3.74 MIL/uL — ABNORMAL LOW (ref 3.80–5.20)
RDW: 15.9 % — AB (ref 11.5–14.5)
WBC: 10.3 10*3/uL (ref 3.6–11.0)

## 2017-09-18 LAB — URINALYSIS, COMPLETE (UACMP) WITH MICROSCOPIC
Bilirubin Urine: NEGATIVE
Glucose, UA: NEGATIVE mg/dL
Ketones, ur: NEGATIVE mg/dL
Nitrite: NEGATIVE
PROTEIN: NEGATIVE mg/dL
SPECIFIC GRAVITY, URINE: 1.008 (ref 1.005–1.030)
pH: 6 (ref 5.0–8.0)

## 2017-09-18 LAB — MAGNESIUM: Magnesium: 1.6 mg/dL — ABNORMAL LOW (ref 1.7–2.4)

## 2017-09-18 LAB — PREGNANCY, URINE: PREG TEST UR: NEGATIVE

## 2017-09-18 SURGERY — LAPAROSCOPIC CHOLECYSTECTOMY
Anesthesia: General

## 2017-09-18 MED ORDER — FENTANYL CITRATE (PF) 100 MCG/2ML IJ SOLN
INTRAMUSCULAR | Status: AC
  Filled 2017-09-18: qty 2

## 2017-09-18 MED ORDER — ROCURONIUM BROMIDE 50 MG/5ML IV SOLN
INTRAVENOUS | Status: AC
  Filled 2017-09-18: qty 1

## 2017-09-18 MED ORDER — DEXAMETHASONE SODIUM PHOSPHATE 10 MG/ML IJ SOLN
INTRAMUSCULAR | Status: DC | PRN
  Administered 2017-09-18: 10 mg via INTRAVENOUS

## 2017-09-18 MED ORDER — ONDANSETRON HCL 4 MG/2ML IJ SOLN
INTRAMUSCULAR | Status: DC | PRN
  Administered 2017-09-18: 4 mg via INTRAVENOUS

## 2017-09-18 MED ORDER — KETOROLAC TROMETHAMINE 30 MG/ML IJ SOLN
INTRAMUSCULAR | Status: DC | PRN
  Administered 2017-09-18: 30 mg via INTRAVENOUS

## 2017-09-18 MED ORDER — POTASSIUM CHLORIDE 20 MEQ PO PACK
40.0000 meq | PACK | Freq: Once | ORAL | Status: DC
  Filled 2017-09-18: qty 2

## 2017-09-18 MED ORDER — FENTANYL CITRATE (PF) 100 MCG/2ML IJ SOLN
INTRAMUSCULAR | Status: AC
  Administered 2017-09-18: 25 ug via INTRAVENOUS
  Filled 2017-09-18: qty 2

## 2017-09-18 MED ORDER — ROCURONIUM BROMIDE 100 MG/10ML IV SOLN
INTRAVENOUS | Status: DC | PRN
  Administered 2017-09-18: 40 mg via INTRAVENOUS
  Administered 2017-09-18: 20 mg via INTRAVENOUS
  Administered 2017-09-18: 10 mg via INTRAVENOUS

## 2017-09-18 MED ORDER — SUGAMMADEX SODIUM 500 MG/5ML IV SOLN
INTRAVENOUS | Status: DC | PRN
  Administered 2017-09-18: 250 mg via INTRAVENOUS

## 2017-09-18 MED ORDER — KETOROLAC TROMETHAMINE 30 MG/ML IJ SOLN
30.0000 mg | Freq: Four times a day (QID) | INTRAMUSCULAR | Status: DC
  Administered 2017-09-18 – 2017-09-19 (×3): 30 mg via INTRAVENOUS
  Filled 2017-09-18 (×3): qty 1

## 2017-09-18 MED ORDER — PROPOFOL 10 MG/ML IV BOLUS
INTRAVENOUS | Status: DC | PRN
  Administered 2017-09-18: 170 mg via INTRAVENOUS

## 2017-09-18 MED ORDER — SUCCINYLCHOLINE CHLORIDE 20 MG/ML IJ SOLN
INTRAMUSCULAR | Status: DC | PRN
  Administered 2017-09-18: 120 mg via INTRAVENOUS

## 2017-09-18 MED ORDER — FENTANYL CITRATE (PF) 100 MCG/2ML IJ SOLN
INTRAMUSCULAR | Status: DC | PRN
  Administered 2017-09-18 (×2): 50 ug via INTRAVENOUS
  Administered 2017-09-18: 100 ug via INTRAVENOUS

## 2017-09-18 MED ORDER — LIDOCAINE HCL (CARDIAC) 20 MG/ML IV SOLN
INTRAVENOUS | Status: DC | PRN
  Administered 2017-09-18: 50 mg via INTRAVENOUS

## 2017-09-18 MED ORDER — SUGAMMADEX SODIUM 500 MG/5ML IV SOLN
INTRAVENOUS | Status: AC
  Filled 2017-09-18: qty 5

## 2017-09-18 MED ORDER — PHENYLEPHRINE HCL 10 MG/ML IJ SOLN
INTRAMUSCULAR | Status: DC | PRN
  Administered 2017-09-18 (×3): 100 ug via INTRAVENOUS

## 2017-09-18 MED ORDER — ONDANSETRON HCL 4 MG/2ML IJ SOLN
4.0000 mg | Freq: Once | INTRAMUSCULAR | Status: AC | PRN
  Administered 2017-09-18: 4 mg via INTRAVENOUS

## 2017-09-18 MED ORDER — ONDANSETRON HCL 4 MG/2ML IJ SOLN
INTRAMUSCULAR | Status: AC
  Administered 2017-09-18: 4 mg via INTRAVENOUS
  Filled 2017-09-18: qty 2

## 2017-09-18 MED ORDER — BUPIVACAINE-EPINEPHRINE 0.25% -1:200000 IJ SOLN
INTRAMUSCULAR | Status: DC | PRN
  Administered 2017-09-18: 30 mL

## 2017-09-18 MED ORDER — POTASSIUM CHLORIDE CRYS ER 20 MEQ PO TBCR
40.0000 meq | EXTENDED_RELEASE_TABLET | Freq: Once | ORAL | Status: AC
  Administered 2017-09-18: 40 meq via ORAL
  Filled 2017-09-18: qty 2

## 2017-09-18 MED ORDER — BUPIVACAINE-EPINEPHRINE (PF) 0.25% -1:200000 IJ SOLN
INTRAMUSCULAR | Status: AC
Start: 2017-09-18 — End: 2017-09-18
  Filled 2017-09-18: qty 30

## 2017-09-18 MED ORDER — FENTANYL CITRATE (PF) 100 MCG/2ML IJ SOLN
25.0000 ug | INTRAMUSCULAR | Status: AC | PRN
  Administered 2017-09-18 (×6): 25 ug via INTRAVENOUS

## 2017-09-18 MED ORDER — EPHEDRINE SULFATE 50 MG/ML IJ SOLN
INTRAMUSCULAR | Status: DC | PRN
  Administered 2017-09-18 (×2): 10 mg via INTRAVENOUS

## 2017-09-18 SURGICAL SUPPLY — 45 items
APPLICATOR COTTON TIP 6IN STRL (MISCELLANEOUS) IMPLANT
APPLIER CLIP 5 13 M/L LIGAMAX5 (MISCELLANEOUS) ×3
BLADE SURG 15 STRL LF DISP TIS (BLADE) ×1 IMPLANT
BLADE SURG 15 STRL SS (BLADE) ×2
CANISTER SUCT 1200ML W/VALVE (MISCELLANEOUS) ×3 IMPLANT
CHLORAPREP W/TINT 26ML (MISCELLANEOUS) ×3 IMPLANT
CHOLANGIOGRAM CATH TAUT (CATHETERS) IMPLANT
CLEANER CAUTERY TIP 5X5 PAD (MISCELLANEOUS) ×1 IMPLANT
CLIP APPLIE 5 13 M/L LIGAMAX5 (MISCELLANEOUS) ×1 IMPLANT
DECANTER SPIKE VIAL GLASS SM (MISCELLANEOUS) IMPLANT
DERMABOND ADVANCED (GAUZE/BANDAGES/DRESSINGS) ×2
DERMABOND ADVANCED .7 DNX12 (GAUZE/BANDAGES/DRESSINGS) ×1 IMPLANT
DRAPE C-ARM XRAY 36X54 (DRAPES) ×3 IMPLANT
ELECT CAUTERY BLADE 6.4 (BLADE) ×3 IMPLANT
ELECT REM PT RETURN 9FT ADLT (ELECTROSURGICAL) ×3
ELECTRODE REM PT RTRN 9FT ADLT (ELECTROSURGICAL) ×1 IMPLANT
GLOVE BIO SURGEON STRL SZ7 (GLOVE) ×3 IMPLANT
GOWN STRL REUS W/ TWL LRG LVL3 (GOWN DISPOSABLE) ×3 IMPLANT
GOWN STRL REUS W/TWL LRG LVL3 (GOWN DISPOSABLE) ×6
IRRIGATION STRYKERFLOW (MISCELLANEOUS) ×1 IMPLANT
IRRIGATOR STRYKERFLOW (MISCELLANEOUS) ×3
IV CATH ANGIO 12GX3 LT BLUE (NEEDLE) ×3 IMPLANT
IV NS 1000ML (IV SOLUTION) ×2
IV NS 1000ML BAXH (IV SOLUTION) ×1 IMPLANT
L-HOOK LAP DISP 36CM (ELECTROSURGICAL) ×3
LHOOK LAP DISP 36CM (ELECTROSURGICAL) ×1 IMPLANT
NEEDLE HYPO 22GX1.5 SAFETY (NEEDLE) ×3 IMPLANT
PACK LAP CHOLECYSTECTOMY (MISCELLANEOUS) ×3 IMPLANT
PAD CLEANER CAUTERY TIP 5X5 (MISCELLANEOUS) ×2
PENCIL ELECTRO HAND CTR (MISCELLANEOUS) ×3 IMPLANT
POUCH SPECIMEN RETRIEVAL 10MM (ENDOMECHANICALS) ×3 IMPLANT
SCISSORS METZENBAUM CVD 33 (INSTRUMENTS) ×3 IMPLANT
SLEEVE ENDOPATH XCEL 5M (ENDOMECHANICALS) ×6 IMPLANT
SOL ANTI-FOG 6CC FOG-OUT (MISCELLANEOUS) ×1 IMPLANT
SOL FOG-OUT ANTI-FOG 6CC (MISCELLANEOUS) ×2
SPONGE LAP 18X18 5 PK (GAUZE/BANDAGES/DRESSINGS) ×3 IMPLANT
STOPCOCK 4 WAY LG BORE MALE ST (IV SETS) IMPLANT
SUT ETHIBOND 0 MO6 C/R (SUTURE) IMPLANT
SUT MNCRL AB 4-0 PS2 18 (SUTURE) ×3 IMPLANT
SUT VICRYL 0 AB UR-6 (SUTURE) ×6 IMPLANT
SYR 20CC LL (SYRINGE) ×3 IMPLANT
TROCAR XCEL BLUNT TIP 100MML (ENDOMECHANICALS) ×3 IMPLANT
TROCAR XCEL NON-BLD 5MMX100MML (ENDOMECHANICALS) ×3 IMPLANT
TUBING INSUFFLATION (TUBING) ×3 IMPLANT
WATER STERILE IRR 1000ML POUR (IV SOLUTION) ×3 IMPLANT

## 2017-09-18 NOTE — Progress Notes (Signed)
[facility] - [place] at [facility]   PATIENT NAME: Morgan Mendoza    MR#:  [id number]  DATE OF BIRTH:  1995/10/30  SUBJECTIVE:  CHIEF COMPLAINT:   Chief Complaint  Patient presents with  . Flank Pain  Patient without complaint, feeling better, for operative intervention later today  REVIEW OF SYSTEMS:  CONSTITUTIONAL: No fever, fatigue or weakness.  EYES: No blurred or double vision.  EARS, NOSE, AND THROAT: No tinnitus or ear pain.  RESPIRATORY: No cough, shortness of breath, wheezing or hemoptysis.  CARDIOVASCULAR: No chest pain, orthopnea, edema.  GASTROINTESTINAL: No nausea, vomiting, diarrhea or abdominal pain.  GENITOURINARY: No dysuria, hematuria.  ENDOCRINE: No polyuria, nocturia,  HEMATOLOGY: No anemia, easy bruising or bleeding SKIN: No rash or lesion. MUSCULOSKELETAL: No joint pain or arthritis.   NEUROLOGIC: No tingling, numbness, weakness.  PSYCHIATRY: No anxiety or depression.   ROS  DRUG ALLERGIES:   Allergies  Allergen Reactions  . Other Hives and Itching    Strawberries, kiwi and oranges    VITALS:  Blood pressure 114/65, pulse 71, temperature 98.7 F (37.1 C), temperature source Oral, resp. rate 18, height 5\' 9"  (1.753 m), weight 124.7 kg (275 lb), SpO2 99 %, unknown if currently breastfeeding.  PHYSICAL EXAMINATION:  GENERAL:  23 y.o.-year-old patient lying in the bed with no acute distress.  EYES: Pupils equal, round, reactive to light and accommodation. No scleral icterus. Extraocular muscles intact.  HEENT: Head atraumatic, normocephalic. Oropharynx and nasopharynx clear.  NECK:  Supple, no jugular venous distention. No thyroid enlargement, no tenderness.  LUNGS: Normal breath sounds bilaterally, no wheezing, rales,rhonchi or crepitation. No use of accessory muscles of respiration.  CARDIOVASCULAR: S1, S2 normal. No murmurs, rubs, or gallops.  ABDOMEN: Soft, nontender, nondistended. Bowel sounds present. No organomegaly or mass.   EXTREMITIES: No pedal edema, cyanosis, or clubbing.  NEUROLOGIC: Cranial nerves II through XII are intact. Muscle strength 5/5 in all extremities. Sensation intact. Gait not checked.  PSYCHIATRIC: The patient is alert and oriented x 3.  SKIN: No obvious rash, lesion, or ulcer.   Physical Exam LABORATORY PANEL:   CBC Recent Labs  Lab 09/18/17 0533  WBC 10.3  HGB 9.9*  HCT 28.7*  PLT 245   ------------------------------------------------------------------------------------------------------------------  Chemistries  Recent Labs  Lab 09/17/17 0619 09/18/17 0533 09/18/17 1052  NA 137 139  --   K 3.1* 3.3*  --   CL 109 112*  --   CO2 22 22  --   GLUCOSE 94 94  --   BUN 5* <5*  --   CREATININE 0.69 0.64  --   CALCIUM 7.8* 8.0*  --   MG  --   --  1.6*  AST 15  --   --   ALT 21  --   --   ALKPHOS 80  --   --   BILITOT 0.3  --   --    ------------------------------------------------------------------------------------------------------------------  Cardiac Enzymes No results for input(s): TROPONINI in the last 168 hours. ------------------------------------------------------------------------------------------------------------------  RADIOLOGY:  No results found.  ASSESSMENT AND PLAN:  This is a 22 year old female admitted for choledocholithiasis.   1. Choledocholithiasis With post ERCP pancreatitis Resolving For cholecystectomy later today by general surgery   2. UTI Resolving Continue empiric Rocephin for 3-day course, repeat urinalysis-initial urine specimen was contaminated  3. Abdominal pain:intractable Secondary to above Resolved  4.    Acute asymptomatic bradycardia  Stable  Cardiology/Dr. Mariah Milling did see patient while in house-no intervention necessary   5.  Hypokalemia Replete with p.o. potassium   All the records are reviewed and case discussed with Care Management/Social Workerr. Management plans discussed with the patient, family and  they are in agreement.  CODE STATUS: full  TOTAL TIME TAKING CARE OF THIS PATIENT: 45 minutes.     POSSIBLE D/C IN 1-2 DAYS, DEPENDING ON CLINICAL CONDITION.   [name] D Salary M.D on 09/18/2017   Between 7am to 6pm - Pager - [phone]  After 6pm go to [email or web address] - password [email or web address]  [facility] [place] [facility]  Office  [phone]  CC: Primary care physician; System, Pcp Not In  Note: This dictation was prepared with Dragon dictation along with smaller phrase technology. Any transcriptional errors that result from this process are unintentional.

## 2017-09-18 NOTE — Anesthesia Procedure Notes (Signed)
Procedure Name: Intubation Date/Time: 09/18/2017 4:13 PM Performed by: Omer JackWeatherly, Rickard Kennerly, CRNA Pre-anesthesia Checklist: Patient identified, Patient being monitored, Timeout performed, Emergency Drugs available and Suction available Patient Re-evaluated:Patient Re-evaluated prior to induction Oxygen Delivery Method: Circle system utilized Preoxygenation: Pre-oxygenation with 100% oxygen Induction Type: IV induction Ventilation: Mask ventilation without difficulty Laryngoscope Size: Miller and 2 Grade View: Grade I Tube type: Oral Tube size: 7.0 mm Number of attempts: 1 Placement Confirmation: ETT inserted through vocal cords under direct vision,  positive ETCO2 and breath sounds checked- equal and bilateral Secured at: 21 cm Tube secured with: Tape Dental Injury: Teeth and Oropharynx as per pre-operative assessment

## 2017-09-18 NOTE — Progress Notes (Signed)
Patient transported to OR at this time.     Casimer Russett Garner, RN 

## 2017-09-18 NOTE — Anesthesia Post-op Follow-up Note (Signed)
Anesthesia QCDR form completed.        

## 2017-09-18 NOTE — Op Note (Signed)
Laparoscopic Cholecystectomy  Pre-operative Diagnosis: Cholecystitis and pancreatitis  Post-operative Diagnosis: Same  Procedure: laparoscopic cholecystectomy  Surgeon: Morgan Bigiego Michiko Lineman, MD FACS  Anesthesia: Gen. with endotracheal tube   Findings: acute Cholecystitis    Estimated Blood Loss: 25                Specimens: Gallbladder           Complications: none   Procedure Details  The patient was seen again in the Holding Room. The benefits, complications, treatment options, and expected outcomes were discussed with the patient. The risks of bleeding, infection, recurrence of symptoms, failure to resolve symptoms, bile duct damage, bile duct leak, retained common bile duct stone, bowel injury, any of which could require further surgery and/or ERCP, stent, or papillotomy were reviewed with the patient. The likelihood of improving the patient's symptoms with return to their baseline status is good.  The patient and/or family concurred with the proposed plan, giving informed consent.  The patient was taken to Operating Room, identified as Morgan Mendoza and the procedure verified as Laparoscopic Cholecystectomy.  A Time Out was held and the above information confirmed.  Prior to the induction of general anesthesia, antibiotic prophylaxis was administered. VTE prophylaxis was in place. General endotracheal anesthesia was then administered and tolerated well. After the induction, the abdomen was prepped with Chloraprep and draped in the sterile fashion. The patient was positioned in the supine position.  Cut down technique was used to enter the abdominal cavity and a Hasson trochar was placed after two vicryl stitches were anchored to the fascia. Pneumoperitoneum was then created with CO2 and tolerated well without any adverse changes in the patient's vital signs.  Three 5-mm ports were placed in the right upper quadrant all under direct vision. All skin incisions  were infiltrated with a local  anesthetic agent before making the incision and placing the trocars.   The patient was positioned  in reverse Trendelenburg, tilted slightly to the patient's left.  The gallbladder was identified, the fundus grasped and retracted cephalad. Adhesions were lysed bluntly. The infundibulum was grasped and retracted laterally, exposing the peritoneum overlying the triangle of Calot. This was then divided and exposed in a blunt fashion. An extended critical view of the cystic duct and cystic artery was obtained.  The cystic duct was clearly identified and bluntly dissected.   Artery and duct were double clipped and divided.  The gallbladder was taken from the gallbladder fossa in a retrograde fashion with the electrocautery. The gallbladder was removed and placed in an Endocatch bag. The liver bed was irrigated and inspected. Hemostasis was achieved with the electrocautery. Copious irrigation was utilized and was repeatedly aspirated until clear.  The gallbladder and Endocatch sac were then removed through a port site.     Inspection of the right upper quadrant was performed. No bleeding, bile duct injury or leak, or bowel injury was noted. Pneumoperitoneum was released.  The periumbilical port site was closed with interrumpted 0 Vicryl sutures. 4-0 subcuticular Monocryl was used to close the skin. Dermabond was  applied.  The patient was then extubated and brought to the recovery room in stable condition. Sponge, lap, and needle counts were correct at closure and at the conclusion of the case.               Morgan Bigiego Gimena Buick, MD, FACS

## 2017-09-18 NOTE — Transfer of Care (Signed)
Immediate Anesthesia Transfer of Care Note  Patient: Morgan Mendoza  Procedure(s) Performed: LAPAROSCOPIC CHOLECYSTECTOMY (N/A )  Patient Location: PACU  Anesthesia Type:General  Level of Consciousness: drowsy and patient cooperative  Airway & Oxygen Therapy: Patient Spontanous Breathing and Patient connected to face mask oxygen  Post-op Assessment: Report given to RN and Post -op Vital signs reviewed and stable  Post vital signs: Reviewed and stable  Last Vitals:  Vitals Value Taken Time  BP 129/55 09/18/2017  5:31 PM  Temp 36.7 C 09/18/2017  5:30 PM  Pulse 70 09/18/2017  5:36 PM  Resp 12 09/18/2017  5:36 PM  SpO2 100 % 09/18/2017  5:36 PM  Vitals shown include unvalidated device data.  Last Pain:  Vitals:   09/18/17 1730  TempSrc:   PainSc: 0-No pain      Patients Stated Pain Goal: 0 (09/17/17 0350)  Complications: No apparent anesthesia complications

## 2017-09-18 NOTE — Anesthesia Postprocedure Evaluation (Signed)
Anesthesia Post Note  Patient: Morgan Mendoza  Procedure(s) Performed: LAPAROSCOPIC CHOLECYSTECTOMY (N/A )  Patient location during evaluation: PACU Anesthesia Type: General Level of consciousness: awake and alert Pain management: pain level controlled Vital Signs Assessment: post-procedure vital signs reviewed and stable Respiratory status: spontaneous breathing, nonlabored ventilation, respiratory function stable and patient connected to nasal cannula oxygen Cardiovascular status: blood pressure returned to baseline and stable Postop Assessment: no apparent nausea or vomiting Anesthetic complications: no     Last Vitals:  Vitals:   09/18/17 1815 09/18/17 1820  BP: (!) 118/52   Pulse: 70 66  Resp: 13 15  Temp: 36.4 C   SpO2: 98% 98%    Last Pain:  Vitals:   09/18/17 1820  TempSrc:   PainSc: 3                  Oliwia Berzins S

## 2017-09-19 ENCOUNTER — Encounter: Payer: Self-pay | Admitting: Surgery

## 2017-09-19 LAB — COMPREHENSIVE METABOLIC PANEL
ALBUMIN: 2.8 g/dL — AB (ref 3.5–5.0)
ALK PHOS: 77 U/L (ref 38–126)
ALT: 31 U/L (ref 14–54)
ANION GAP: 7 (ref 5–15)
AST: 27 U/L (ref 15–41)
BUN: <5 mg/dL — ABNORMAL LOW (ref 6–20)
CALCIUM: 8.4 mg/dL — AB (ref 8.9–10.3)
CHLORIDE: 108 mmol/L (ref 101–111)
CO2: 22 mmol/L (ref 22–32)
Creatinine, Ser: 0.62 mg/dL (ref 0.44–1.00)
GFR calc Af Amer: >60 mL/min (ref >60)
GFR calc non Af Amer: >60 mL/min (ref >60)
GLUCOSE: 94 mg/dL (ref 65–99)
Potassium: 3.9 mmol/L (ref 3.5–5.1)
SODIUM: 137 mmol/L (ref 135–145)
Total Bilirubin: 0.4 mg/dL (ref 0.3–1.2)
Total Protein: 6.9 g/dL (ref 6.5–8.1)

## 2017-09-19 LAB — CBC
HCT: 31 % — ABNORMAL LOW (ref 35.0–47.0)
Hemoglobin: 10.2 g/dL — ABNORMAL LOW (ref 12.0–16.0)
MCH: 25.7 pg — AB (ref 26.0–34.0)
MCHC: 32.8 g/dL (ref 32.0–36.0)
MCV: 78.3 fL — AB (ref 80.0–100.0)
Platelets: 297 10*3/uL (ref 150–440)
RBC: 3.96 MIL/uL (ref 3.80–5.20)
RDW: 16 % — ABNORMAL HIGH (ref 11.5–14.5)
WBC: 12.3 10*3/uL — ABNORMAL HIGH (ref 3.6–11.0)

## 2017-09-19 LAB — VITAMIN D 25 HYDROXY (VIT D DEFICIENCY, FRACTURES): VIT D 25 HYDROXY: 22.1 ng/mL — AB (ref 30.0–100.0)

## 2017-09-19 MED ORDER — MAGNESIUM OXIDE 400 (241.3 MG) MG PO TABS: 800.0000 mg | ORAL_TABLET | Freq: Every day | ORAL | 0 refills | Status: DC

## 2017-09-19 MED ORDER — CEFTRIAXONE SODIUM 2 G IJ SOLR
2.0000 g | INTRAMUSCULAR | Status: AC
  Administered 2017-09-19: 2 g via INTRAVENOUS
  Filled 2017-09-19: qty 20

## 2017-09-19 MED ORDER — MAGNESIUM OXIDE 400 (241.3 MG) MG PO TABS
800.0000 mg | ORAL_TABLET | Freq: Every day | ORAL | Status: DC
  Administered 2017-09-19: 800 mg via ORAL
  Filled 2017-09-19: qty 2

## 2017-09-19 NOTE — Progress Notes (Signed)
POD # 1 Doing great AVSS Minimal pain Abd: incisions c/d/i no peritonitis  A/P doing well Main issue was that she did not receive her rocephin as schedule post op. I will redose her this am and should not need any further A/Bs ( d/w Irving Burton RN and she will address this issue w our safety portal) She can go home and f/u in our office in 2 weeks

## 2017-09-19 NOTE — Discharge Instructions (Signed)
Laparoscopic Cholecystectomy, Care After °This sheet gives you information about how to care for yourself after your procedure. Your doctor may also give you more specific instructions. If you have problems or questions, contact your doctor. °Follow these instructions at home: °Care for cuts from surgery (incisions) ° °· Follow instructions from your doctor about how to take care of your cuts from surgery. Make sure you: °? Wash your hands with soap and water before you change your bandage (dressing). If you cannot use soap and water, use hand sanitizer. °? Change your bandage as told by your doctor. °? Leave stitches (sutures), skin glue, or skin tape (adhesive) strips in place. They may need to stay in place for 2 weeks or longer. If tape strips get loose and curl up, you may trim the loose edges. Do not remove tape strips completely unless your doctor says it is okay. °· Do not take baths, swim, or use a hot tub until your doctor says it is okay. Ask your doctor if you can take showers. You may only be allowed to take sponge baths for bathing. °· Check your surgical cut area every day for signs of infection. Check for: °? More redness, swelling, or pain. °? More fluid or blood. °? Warmth. °? Pus or a bad smell. °Activity °· Do not drive or use heavy machinery while taking prescription pain medicine. °· Do not lift anything that is heavier than 10 lb (4.5 kg) until your doctor says it is okay. °· Do not play contact sports until your doctor says it is okay. °· Do not drive for 24 hours if you were given a medicine to help you relax (sedative). °· Rest as needed. Do not return to work or school until your doctor says it is okay. °General instructions °· Take over-the-counter and prescription medicines only as told by your doctor. °· To prevent or treat constipation while you are taking prescription pain medicine, your doctor may recommend that you: °? Drink enough fluid to keep your pee (urine) clear or pale  yellow. °? Take over-the-counter or prescription medicines. °? Eat foods that are high in fiber, such as fresh fruits and vegetables, whole grains, and beans. °? Limit foods that are high in fat and processed sugars, such as fried and sweet foods. °Contact a doctor if: °· You develop a rash. °· You have more redness, swelling, or pain around your surgical cuts. °· You have more fluid or blood coming from your surgical cuts. °· Your surgical cuts feel warm to the touch. °· You have pus or a bad smell coming from your surgical cuts. °· You have a fever. °· One or more of your surgical cuts breaks open. °Get help right away if: °· You have trouble breathing. °· You have chest pain. °· You have pain that is getting worse in your shoulders. °· You faint or feel dizzy when you stand. °· You have very bad pain in your belly (abdomen). °· You are sick to your stomach (nauseous) for more than one day. °· You have throwing up (vomiting) that lasts for more than one day. °· You have leg pain. °This information is not intended to replace advice given to you by your health care provider. Make sure you discuss any questions you have with your health care provider. °Document Released: 03/04/2008 Document Revised: 12/15/2015 Document Reviewed: 11/12/2015 °Elsevier Interactive Patient Education © 2018 [employer] ° °

## 2017-09-19 NOTE — Discharge Summary (Signed)
[facility] [place] [facility]   PATIENT NAME: Morgan ChapmanSavana Mendoza    MR#:  [id number]  DATE OF BIRTH:  June 02, 1996  DATE OF ADMISSION:  09/15/2017 ADMITTING PHYSICIAN: Arnaldo NatalMichael S Diamond, MD  DATE OF DISCHARGE: No discharge date for patient encounter.  PRIMARY CARE PHYSICIAN: System, Pcp Not In    ADMISSION DIAGNOSIS:  Choledocholithiasis [K80.50] Vomiting [R11.10] RUQ pain [R10.11] Calculus of gallbladder without cholecystitis without obstruction [K80.20]  DISCHARGE DIAGNOSIS:  Active Problems:   Choledocholithiasis   Calculus of gallbladder without cholecystitis without obstruction   SECONDARY DIAGNOSIS:   Past Medical History:  Diagnosis Date  . Anemia   . Obesity    BMI 42 on 09/19/2016    HOSPITAL COURSE:  This is a 22 year old female admitted for choledocholithiasis.   1. Choledocholithiasis With post ERCP pancreatitis Resolved Status post lap chole by general surgery on September 18, 2017-cleared for discharge per surgery with outpatient follow-up status post discharge    2. UTI Treated with empiric Rocephin while in house, initial urine specimen was contaminated, repeat culture sent-pending at the time of discharge, will need to be followed up by OB/GYN status post discharge  3. Abdominal pain:intractable Resolved Secondary to above  4.   Acute asymptomatic bradycardia  Stable  Case discussed with Dr. Mariah MillingGollan by previous hospitalist-no intervention recommended   5. Hypokalemia Repleted with p.o. potassium   6.  Acute hypomagnesemia Repleted   DISCHARGE CONDITIONS:  On the day of discharge patient is afebrile, hemodynamic is stable, tolerating diet, ambulating without difficulty, cleared for discharge per surgery, ready for discharge home with appropriate follow-up with OB/GYN and general surgery for reevaluation, for more specific details please see chart  CONSULTS OBTAINED:  Treatment Team:  Leafy RoPabon, Diego F, MD  DRUG  ALLERGIES:   Allergies  Allergen Reactions  . Other Hives and Itching    Strawberries, kiwi and oranges    DISCHARGE MEDICATIONS:   Allergies as of 09/19/2017      Reactions   Other Hives, Itching   Strawberries, kiwi and oranges      Medication List    TAKE these medications   ferrous sulfate 325 (65 FE) MG EC tablet Take 1 tablet (325 mg total) by mouth 2 (two) times daily with a meal.   ibuprofen 600 MG tablet Commonly known as:  ADVIL,MOTRIN Take 1 tablet (600 mg total) by mouth every 6 (six) hours.   magnesium oxide 400 (241.3 Mg) MG tablet Commonly known as:  MAG-OX Take 2 tablets (800 mg total) by mouth daily. Start taking on:  09/20/2017   prenatal vitamin w/FE, FA 27-1 MG Tabs tablet Take 1 tablet by mouth daily at 12 noon.        DISCHARGE INSTRUCTIONS:   If you experience worsening of your admission symptoms, develop shortness of breath, life threatening emergency, suicidal or homicidal thoughts you must seek medical attention immediately by calling 911 or calling your MD immediately  if symptoms less severe.  You Must read complete instructions/literature along with all the possible adverse reactions/side effects for all the Medicines you take and that have been prescribed to you. Take any new Medicines after you have completely understood and accept all the possible adverse reactions/side effects.   Please note  You were cared for by a hospitalist during your hospital stay. If you have any questions about your discharge medications or the care you received while you were in the hospital after you are discharged, you can call the unit  and asked to speak with the hospitalist on call if the hospitalist that took care of you is not available. Once you are discharged, your primary care physician will handle any further medical issues. Please note that NO REFILLS for any discharge medications will be authorized once you are discharged, as it is imperative that you  return to your primary care physician (or establish a relationship with a primary care physician if you do not have one) for your aftercare needs so that they can reassess your need for medications and monitor your lab values.    Today   CHIEF COMPLAINT:   Chief Complaint  Patient presents with  . Flank Pain    HISTORY OF PRESENT ILLNESS:  The patient with past medical history of obesity presents to the emergency department complaining of abdominal pain that radiates to her right flank.  The patient has had similar complaints for approximately 1 year.  However, her nausea vomiting and pain have worsened acutely today.  Ultrasound of her abdomen revealed enlarged common bile duct concerning for choledocholithiasis.  The surgical service was consulted prior to the emergency department staff asking the hospitalist service to admit. VITAL SIGNS:  Blood pressure 132/71, pulse 67, temperature 98.8 F (37.1 C), temperature source Oral, resp. rate 18, height 5\' 9"  (1.753 m), weight 129.7 kg (285 lb 15 oz), SpO2 98 %, currently breastfeeding.  I/O:    Intake/Output Summary (Last 24 hours) at 09/19/2017 1201 Last data filed at 09/19/2017 1025 Gross per 24 hour  Intake 3728.67 ml  Output 305 ml  Net 3423.67 ml    PHYSICAL EXAMINATION:  GENERAL:  22 y.o.-year-old patient lying in the bed with no acute distress.  EYES: Pupils equal, round, reactive to light and accommodation. No scleral icterus. Extraocular muscles intact.  HEENT: Head atraumatic, normocephalic. Oropharynx and nasopharynx clear.  NECK:  Supple, no jugular venous distention. No thyroid enlargement, no tenderness.  LUNGS: Normal breath sounds bilaterally, no wheezing, rales,rhonchi or crepitation. No use of accessory muscles of respiration.  CARDIOVASCULAR: S1, S2 normal. No murmurs, rubs, or gallops.  ABDOMEN: Soft, non-tender, non-distended. Bowel sounds present. No organomegaly or mass.  EXTREMITIES: No pedal edema, cyanosis,  or clubbing.  NEUROLOGIC: Cranial nerves II through XII are intact. Muscle strength 5/5 in all extremities. Sensation intact. Gait not checked.  PSYCHIATRIC: The patient is alert and oriented x 3.  SKIN: No obvious rash, lesion, or ulcer.   DATA REVIEW:   CBC Recent Labs  Lab 09/19/17 0857  WBC 12.3*  HGB 10.2*  HCT 31.0*  PLT 297    Chemistries  Recent Labs  Lab 09/18/17 1052 09/19/17 0857  NA  --  137  K  --  3.9  CL  --  108  CO2  --  22  GLUCOSE  --  94  BUN  --  <5*  CREATININE  --  0.62  CALCIUM  --  8.4*  MG 1.6*  --   AST  --  27  ALT  --  31  ALKPHOS  --  77  BILITOT  --  0.4    Cardiac Enzymes No results for input(s): TROPONINI in the last 168 hours.  Microbiology Results  Results for orders placed or performed during the hospital encounter of 09/15/17  Urine culture     Status: Abnormal   Collection Time: 09/14/17 11:16 PM  Result Value Ref Range Status   Specimen Description   Final    URINE, CLEAN CATCH Performed at [facility], [address], [place], [place] [address]    Special Requests   Final    NONE Performed at [facility], [address] [place], [place] [address]    Culture MULTIPLE SPECIES PRESENT, SUGGEST RECOLLECTION (A)  Final   Report Status 09/16/2017 FINAL  Final  MRSA PCR Screening     Status: None   Collection Time: 09/15/17 10:16 AM  Result Value Ref Range Status   MRSA by PCR NEGATIVE NEGATIVE Final    Comment:        The GeneXpert MRSA Assay (FDA approved for NASAL specimens only), is one component of a comprehensive MRSA colonization surveillance program. It is not intended to diagnose MRSA infection nor to guide or monitor treatment for MRSA infections. Performed at [facility], [address] [place], [place] [address]     RADIOLOGY:  No results found.  EKG:   Orders placed or performed during the hospital encounter of 09/15/17  . EKG 12-Lead  . EKG 12-Lead  . EKG  12-Lead  . EKG 12-Lead      Management plans discussed with the patient, family and they are in agreement.  CODE STATUS:     Code Status Orders  (From admission, onward)        Start     Ordered   09/15/17 0514  Full code  Continuous     09/15/17 0513    Code Status History    Date Active Date Inactive Code Status Order ID Comments User Context   09/03/2017 2052 09/05/2017 1859 Full Code [id number]  Tonja, Jezewski [facility] Inpatient   09/03/2017 0049 09/03/2017 2052 Full Code [id number]  Demetro, Prudencio Pair, CNM Inpatient      TOTAL TIME TAKING CARE OF THIS PATIENT: 35 minutes.    Evelena Asa Salary M.D on 09/19/2017 at 12:01 PM  Between 7am to 6pm - Pager - [phone]  After 6pm go to [email or web address] - password [email or web address]  [facility]  Office  [phone]  CC: Primary care physician; System, Pcp Not In   Note: This dictation was prepared with Dragon dictation along with smaller phrase technology. Any transcriptional errors that result from this process are unintentional.

## 2017-09-19 NOTE — Progress Notes (Signed)
Spoke with Dr. Everlene FarrierPabon, OK to DC patient if patient eats without getting nauseas and gets IV antibiotic

## 2017-09-19 NOTE — Progress Notes (Signed)
Spoke with Dr. Katheren ShamsSalary. OK to DC patient since she has eaten and feels fine and IV antibiotic has been administered.

## 2017-09-19 NOTE — Progress Notes (Signed)
Patient discharged home. Discharge instructions, prescriptions and follow up appointment given to and reviewed with patient. Patient verbalized understanding. Patient wheeled out by RN 

## 2017-09-22 LAB — SURGICAL PATHOLOGY

## 2018-07-27 IMAGING — US US ABDOMEN LIMITED
1 series · 14 of 25 positions shown · non-contrast
Comparison: None

CLINICAL DATA: Nausea, vomiting, diarrhea and RIGHT upper quadrant
pain for 4 days

EXAM:
US ABDOMEN LIMITED - RIGHT UPPER QUADRANT

[Series 1: us abdomen limited · 0.23mm/px · 14 of 41 slices shown]
[im 1/41]
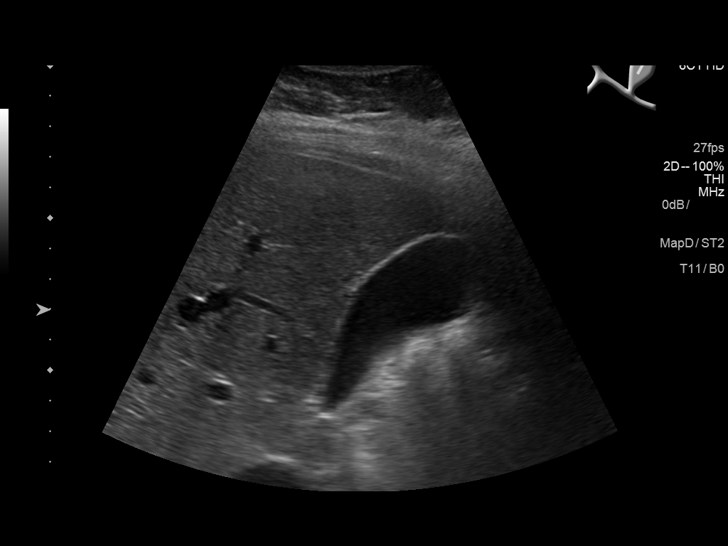
[im 4/41]
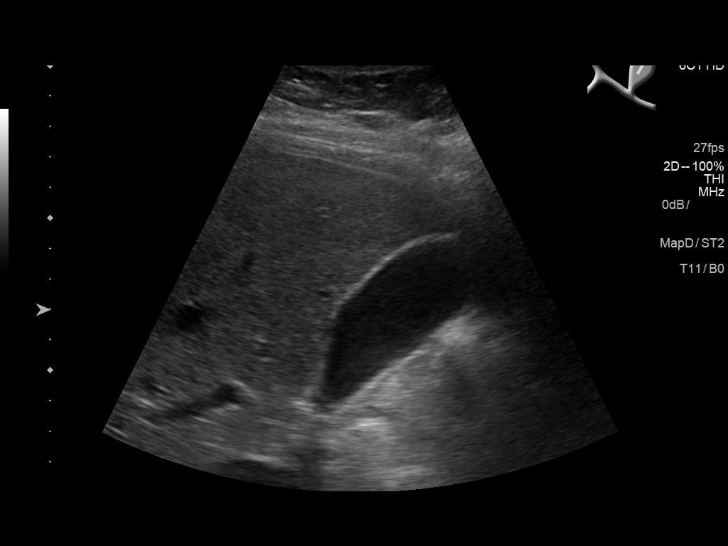
[im 7/41]
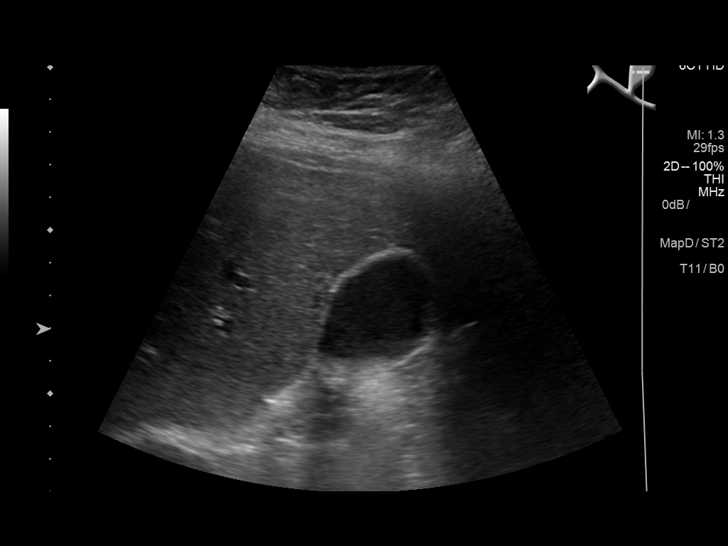
[im 11/41]
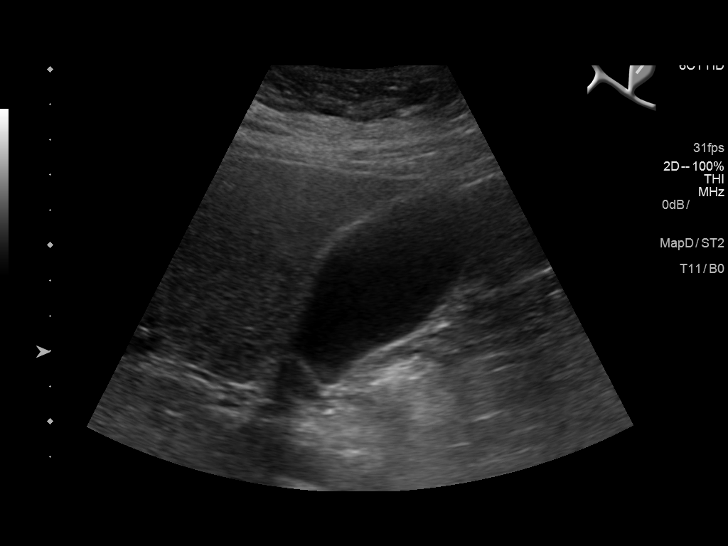
[im 14/41]
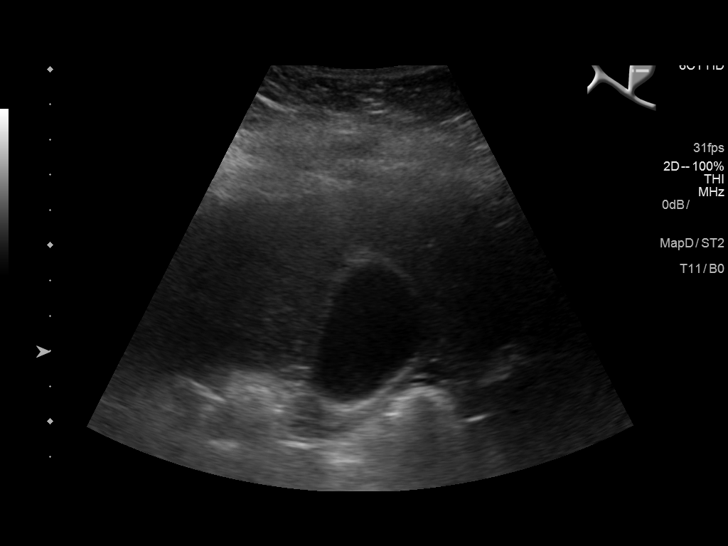
[im 16/41]
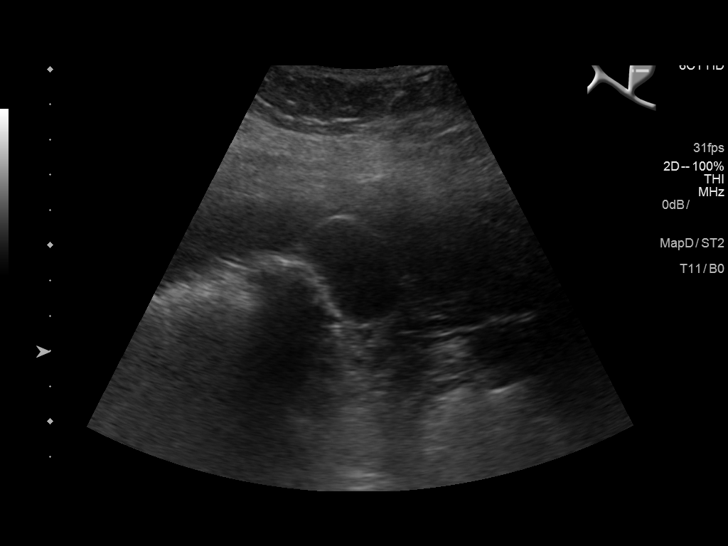
[im 19/41]
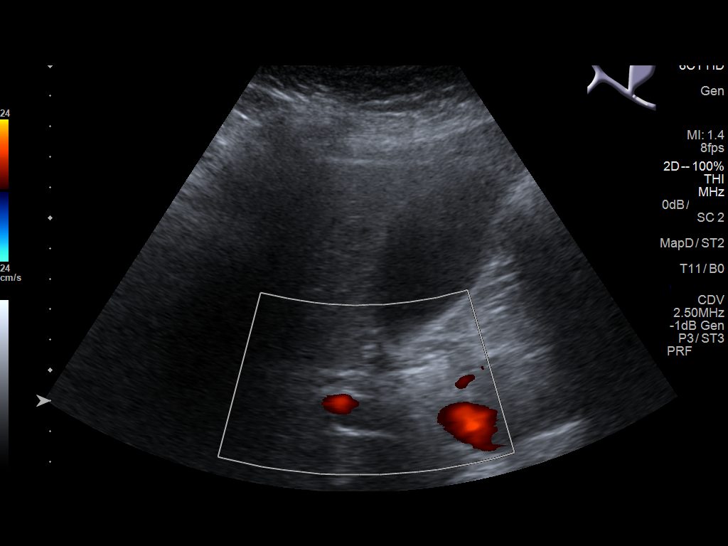
[im 22/41]
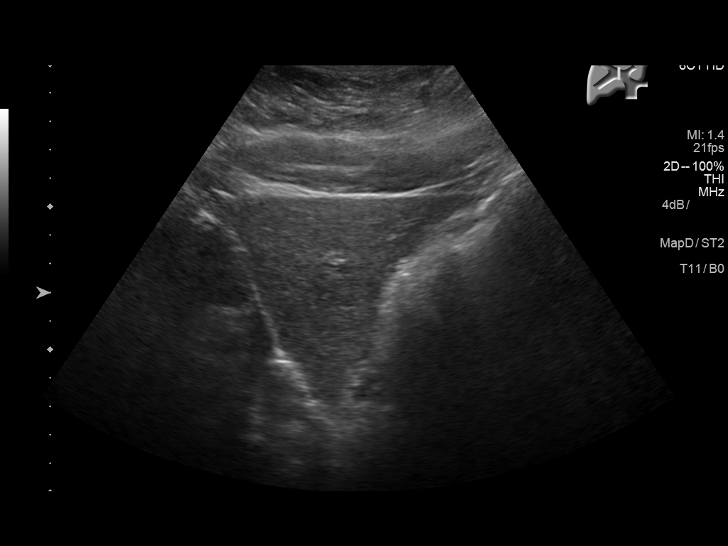
[im 26/41]
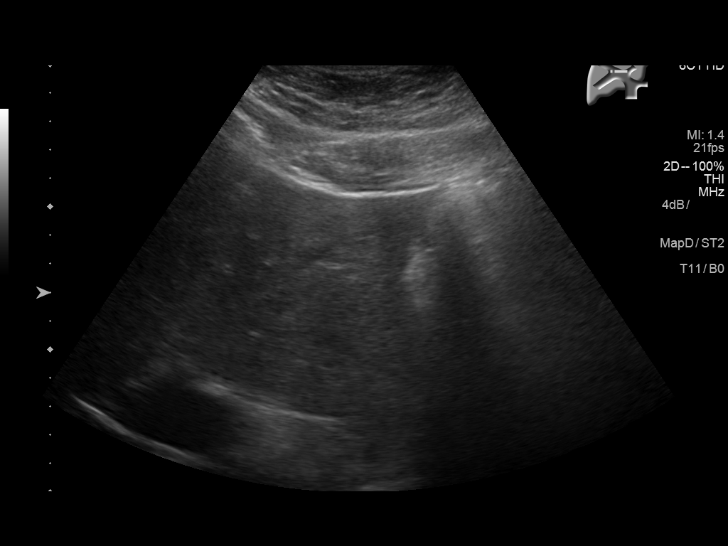
[im 27/41]
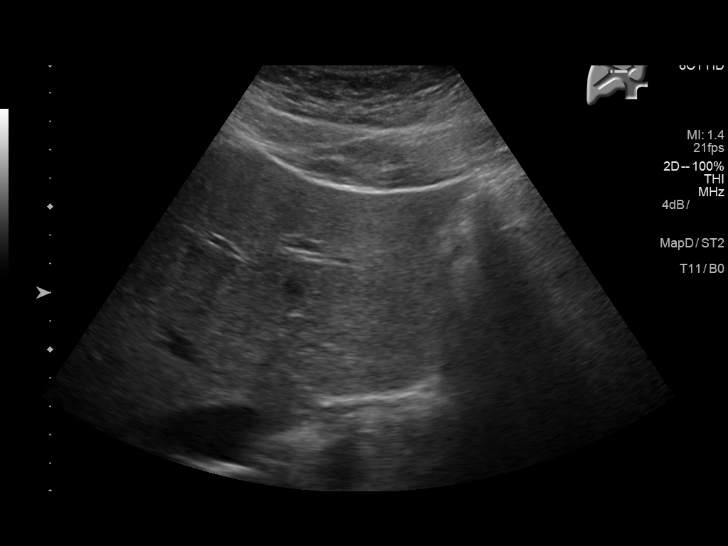
[im 31/41]
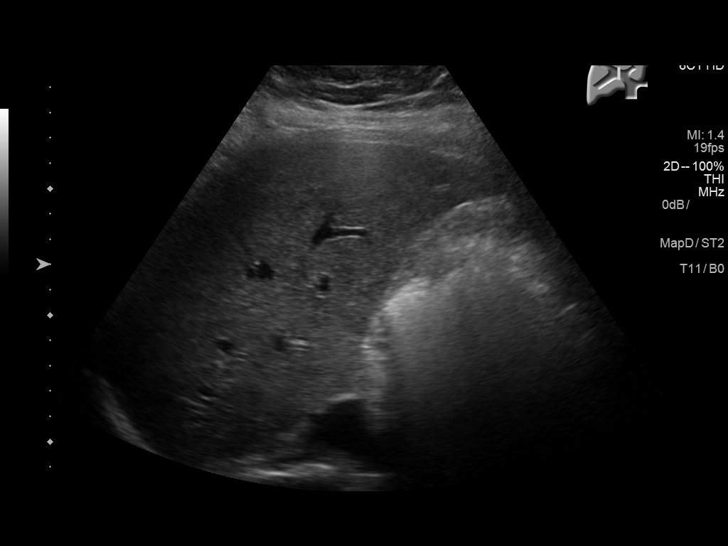
[im 34/41]
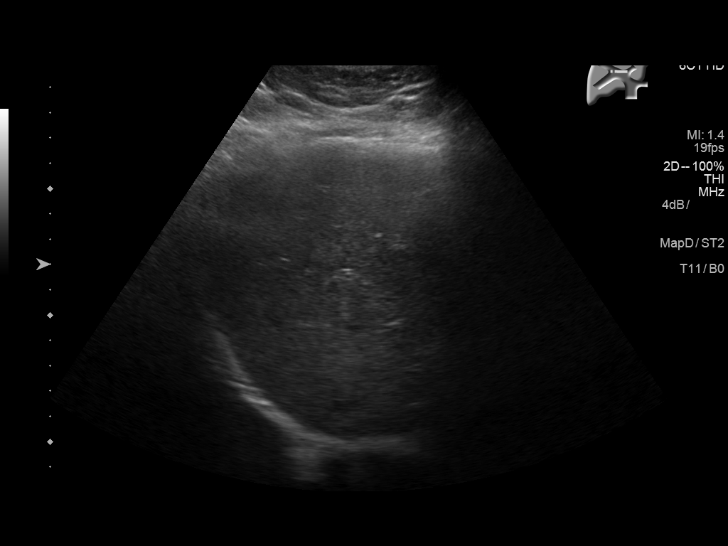
[im 37/41]
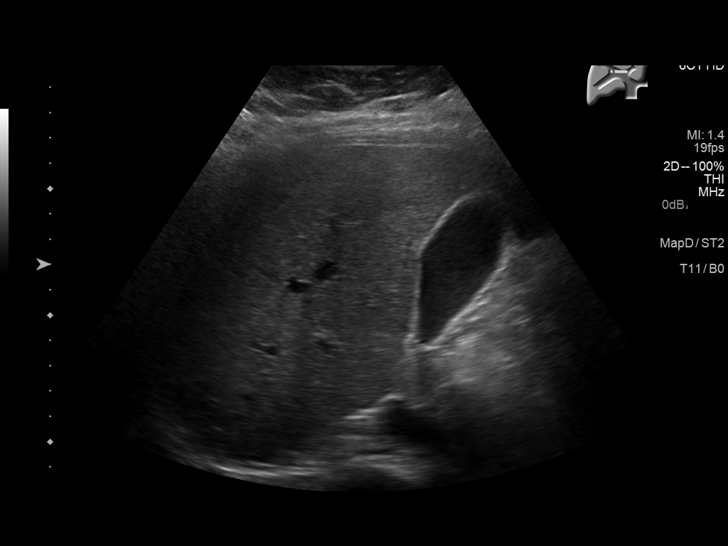
[im 41/41]
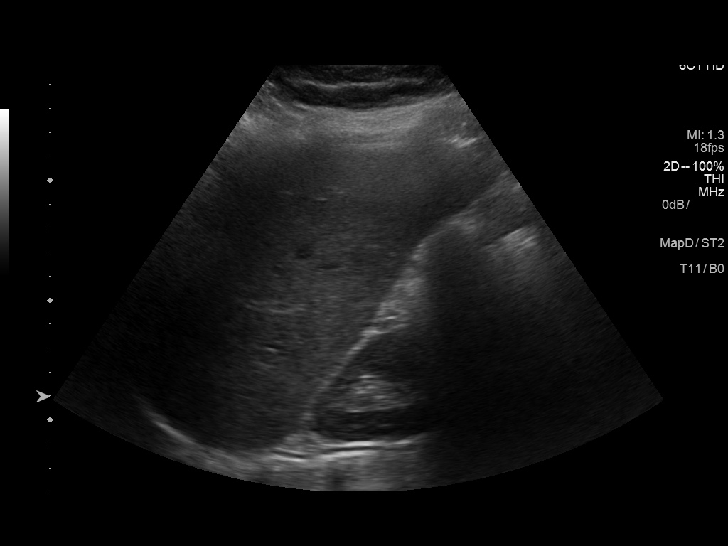

[14 of 25 positions shown; findings below may reference images not displayed]

FINDINGS: Gallbladder:

Normally distended without stones or wall thickening. Question
minimal sludge. No pericholecystic fluid or sonographic Murphy sign.

Common bile duct:

Diameter: 6 mm diameter, upper normal

Liver:

Normal appearance without mass or intrahepatic biliary dilatation.
Hepatopetal portal venous flow.

No RIGHT upper quadrant free fluid.
IMPRESSION: Question minimal sludge within gallbladder.

Otherwise negative exam.

## 2019-08-06 ENCOUNTER — Ambulatory Visit: Payer: BC Managed Care – PPO

## 2020-03-08 IMAGING — US US ABDOMEN LIMITED
1 series · 14 of 25 positions shown · non-contrast
Comparison: Right upper quadrant ultrasound 09/29/2016.

CLINICAL DATA: Cholelithiasis.

EXAM:
ULTRASOUND ABDOMEN LIMITED RIGHT UPPER QUADRANT

[Series 1: us abdomen limited · 14 of 53 slices shown]
[im 1/53]
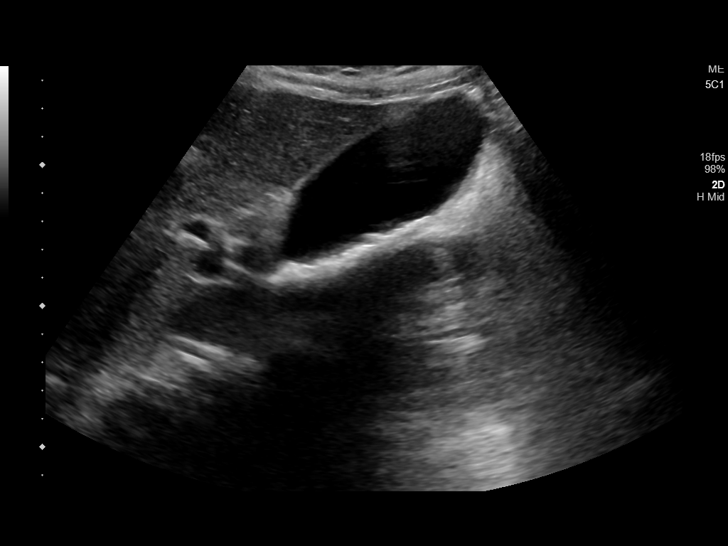
[im 5/53]
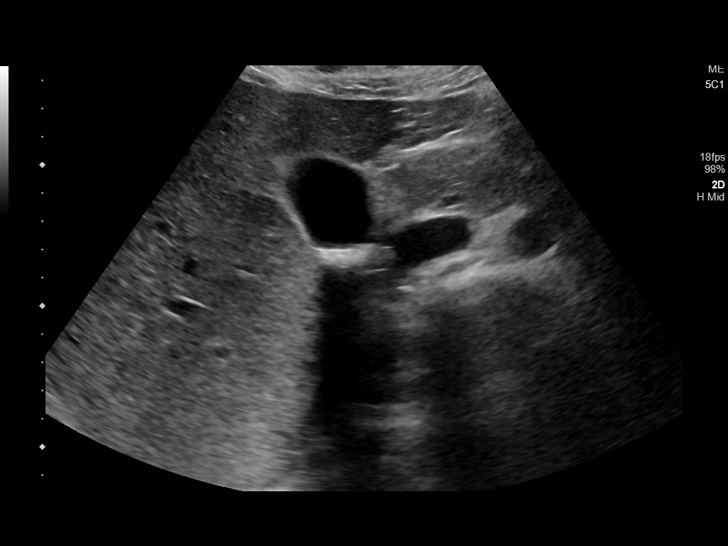
[im 9/53]
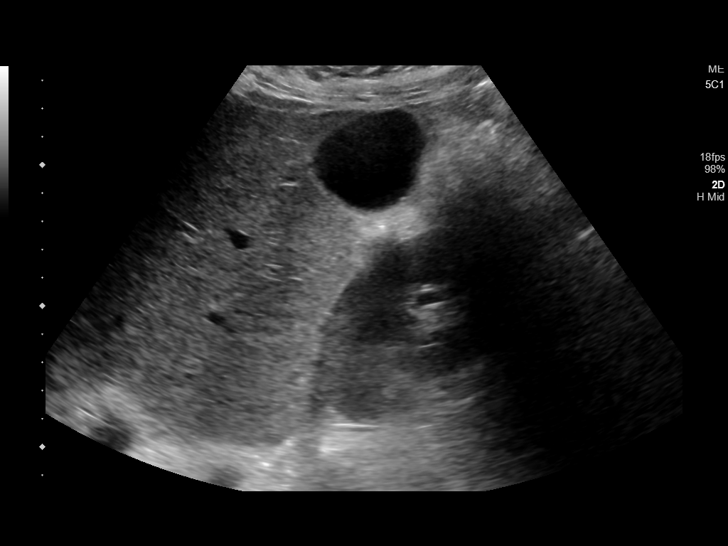
[im 14/53]
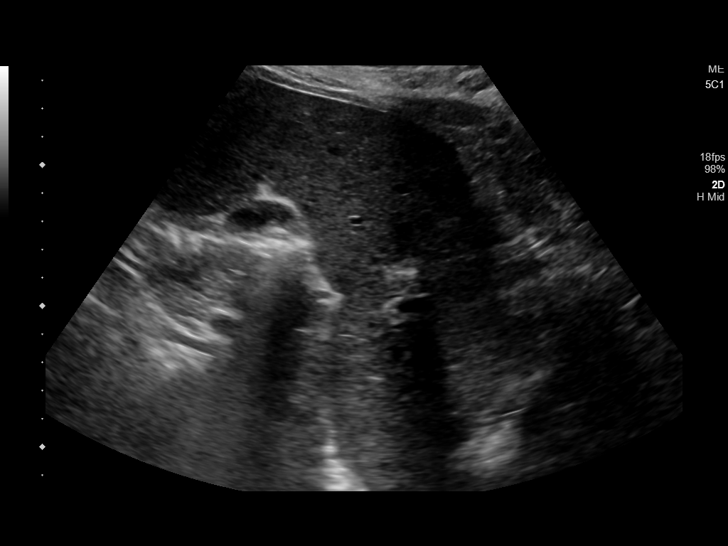
[im 18/53]
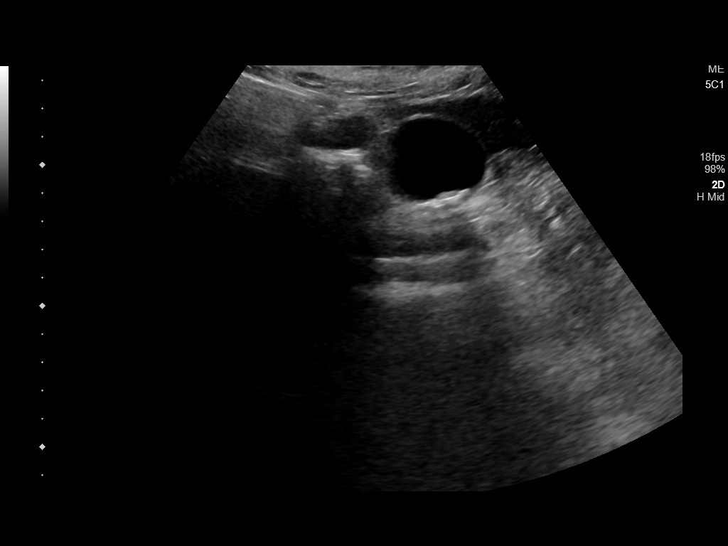
[im 20/53]
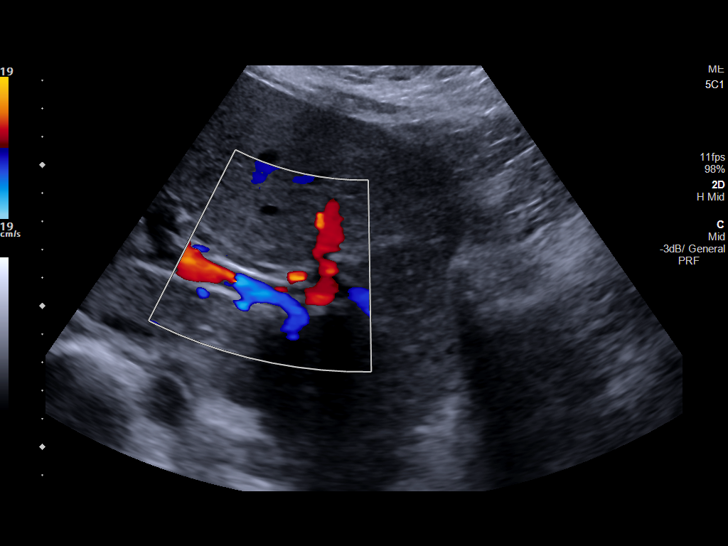
[im 24/53]
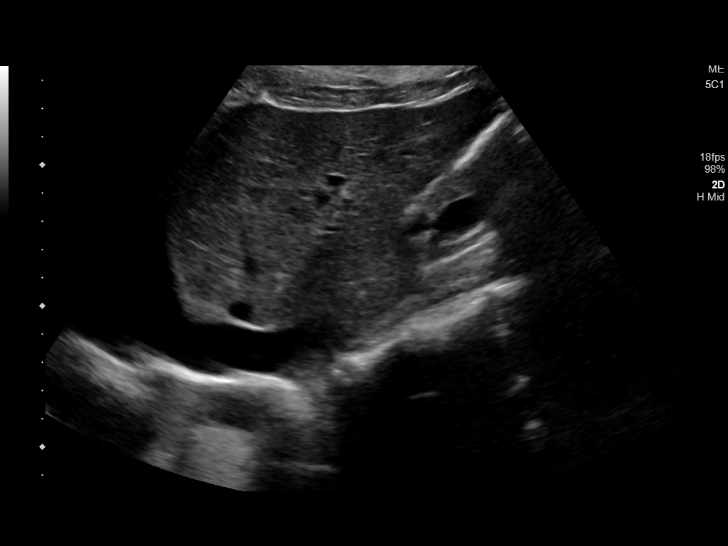
[im 29/53]
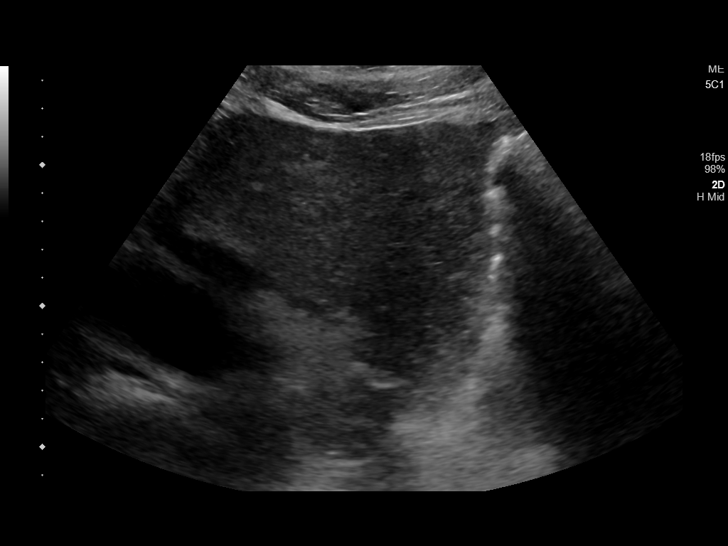
[im 33/53]
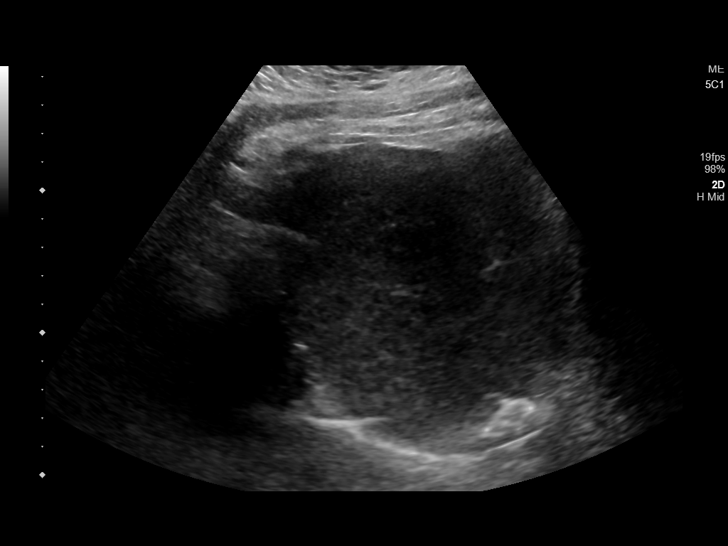
[im 35/53]
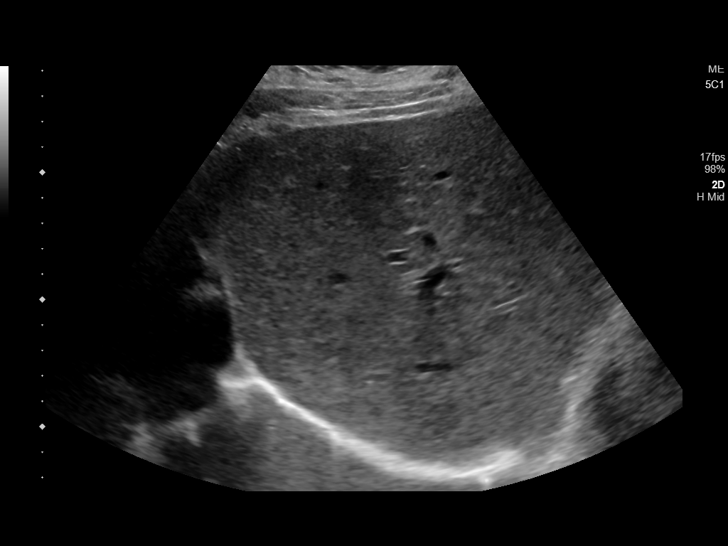
[im 40/53]
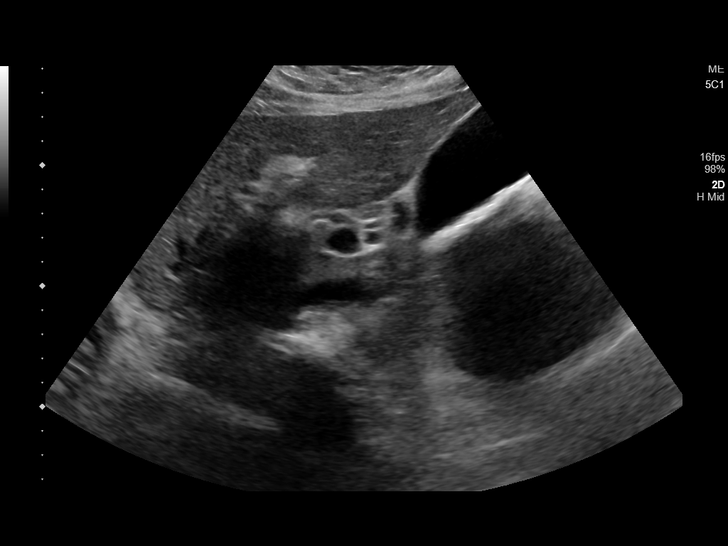
[im 44/53]
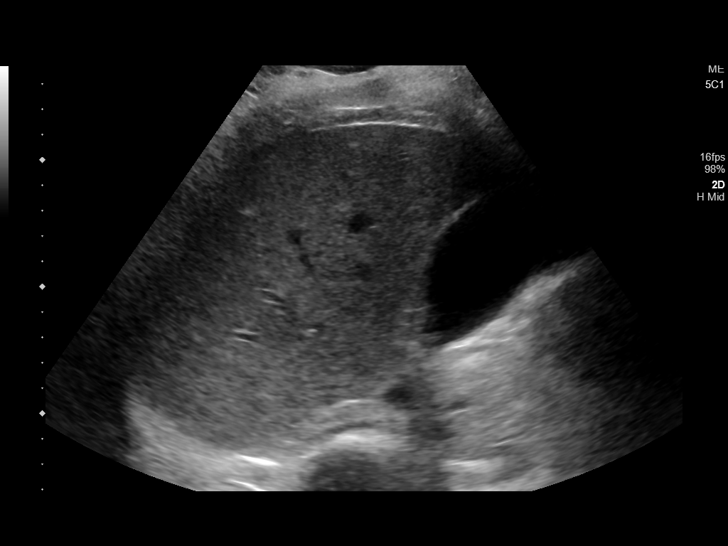
[im 48/53]
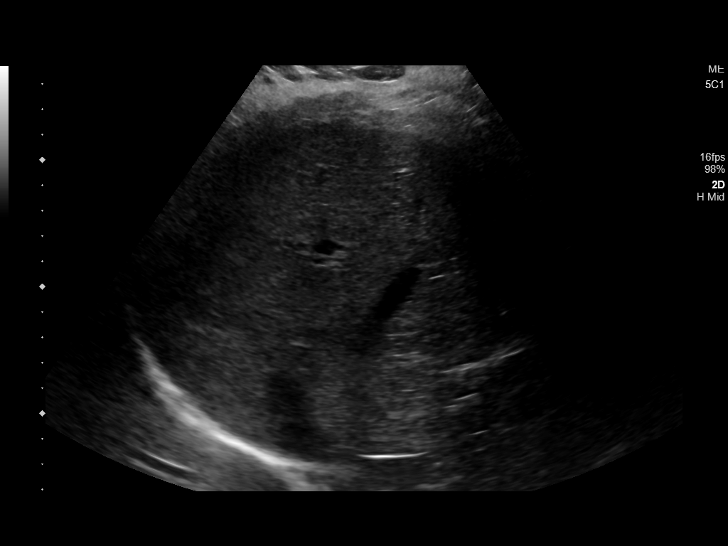
[im 53/53]
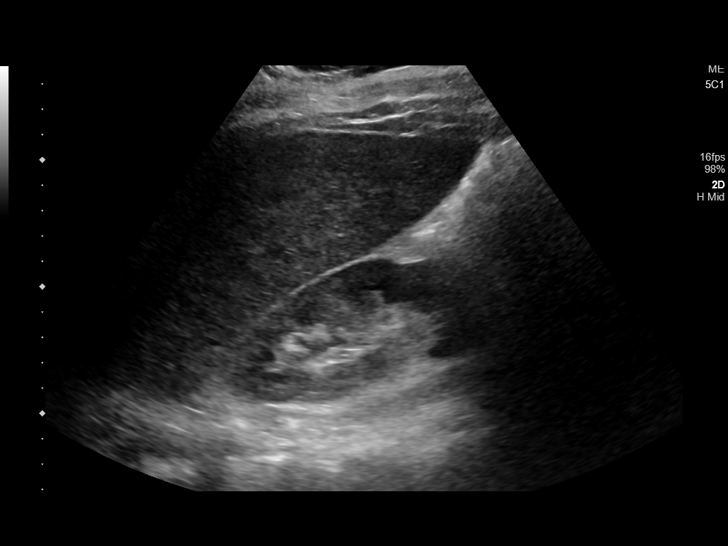

[14 of 25 positions shown; findings below may reference images not displayed]

FINDINGS: Gallbladder:

Multiple small mobile layering gallstones are present. There is no
wall thickening or sonographic Murphy's sign.

Common bile duct:

Diameter: 5.6 mm, within normal limits

Liver:

No focal lesion identified. Within normal limits in parenchymal
echogenicity. Portal vein is patent on color Doppler imaging with
normal direction of blood flow towards the liver.
IMPRESSION: Cholelithiasis without evidence for cholecystitis.

## 2020-09-20 ENCOUNTER — Other Ambulatory Visit: Payer: Self-pay

## 2020-09-20 ENCOUNTER — Ambulatory Visit: Payer: Self-pay

## 2020-09-20 ENCOUNTER — Ambulatory Visit
Admission: EM | Admit: 2020-09-20 | Discharge: 2020-09-20 | Disposition: A | Discharge status: Home / Self Care | Payer: BC Managed Care – PPO | Attending: Emergency Medicine | Admitting: Emergency Medicine

## 2020-09-20 DIAGNOSIS — J069 Acute upper respiratory infection, unspecified: Secondary | ICD-10-CM | POA: Diagnosis not present

## 2020-09-20 DIAGNOSIS — H66002 Acute suppurative otitis media without spontaneous rupture of ear drum, left ear: Secondary | ICD-10-CM | POA: Diagnosis not present

## 2020-09-20 MED ORDER — AMOXICILLIN-POT CLAVULANATE 875-125 MG PO TABS: 1.0000 | ORAL_TABLET | Freq: Two times a day (BID) | ORAL | 0 refills | Status: AC

## 2020-09-20 MED ORDER — PROMETHAZINE-DM 6.25-15 MG/5ML PO SYRP: 5.0000 mL | ORAL_SOLUTION | Freq: Four times a day (QID) | ORAL | 0 refills | Status: DC | PRN

## 2020-09-20 MED ORDER — BENZONATATE 100 MG PO CAPS: 200.0000 mg | ORAL_CAPSULE | Freq: Three times a day (TID) | ORAL | 0 refills | Status: DC

## 2020-09-20 NOTE — ED Provider Notes (Signed)
[facility]    CSN: [id number] Arrival date & time: 09/20/20  1053      History   Chief Complaint Chief Complaint  Patient presents with  . Nasal Congestion  . Otalgia    left    HPI Morgan Mendoza is a 25 y.o. female.   HPI   26 year old female here for evaluation of respiratory complaints.  Patient reports that she has been experiencing nasal congestion with yellow nasal discharge, left ear pain with yellow drainage, ringing in left ear, shortness breath and wheezing at night for the past week.  Patient denies fever, nausea, vomiting, or diarrhea.  Patient did have 1 episode of posttussis emesis this morning but that is an outlier.  Past Medical History:  Diagnosis Date  . Anemia   . Obesity    BMI 42 on 09/19/2016    Patient Active Problem List   Diagnosis Date Noted  . Calculus of gallbladder without cholecystitis without obstruction   . Choledocholithiasis 09/15/2017  . Gestational hypertension 09/03/2017  . Right upper quadrant abdominal pain 09/30/2016  . Contraceptive management 09/19/2016    Past Surgical History:  Procedure Laterality Date  . CHOLECYSTECTOMY N/A 09/18/2017   Procedure: LAPAROSCOPIC CHOLECYSTECTOMY;  Surgeon: Jules Husbands, MD;  Location: [facility] ORS;  Service: General;  Laterality: N/A;  . ENDOSCOPIC RETROGRADE CHOLANGIOPANCREATOGRAPHY (ERCP) WITH PROPOFOL N/A 09/15/2017   Procedure: ENDOSCOPIC RETROGRADE CHOLANGIOPANCREATOGRAPHY (ERCP) WITH PROPOFOL;  Surgeon: Lucilla Lame, MD;  Location: [facility];  Service: Endoscopy;  Laterality: N/A;  . MOUTH SURGERY     had tooth removed    OB History    Gravida  1   Para  1   Term  1   Preterm  0   AB  0   Living  1     SAB  0   IAB  0   Ectopic  0   Multiple  0   Live Births  1            Home Medications    Prior to Admission medications   Medication Sig Start Date End Date Taking? Authorizing Provider  amoxicillin-clavulanate (AUGMENTIN) 875-125 MG  tablet Take 1 tablet by mouth every 12 (twelve) hours for 10 days. 09/20/20 09/30/20 Yes Margarette Canada, NP  benzonatate (TESSALON) 100 MG capsule Take 2 capsules (200 mg total) by mouth every 8 (eight) hours. 09/20/20  Yes Margarette Canada, NP  promethazine-dextromethorphan (PROMETHAZINE-DM) 6.25-15 MG/5ML syrup Take 5 mLs by mouth 4 (four) times daily as needed. 09/20/20  Yes Margarette Canada, NP  ferrous sulfate 325 (65 FE) MG EC tablet Take 1 tablet (325 mg total) by mouth 2 (two) times daily with a meal. 09/05/17 09/20/20  Lisette Grinder, CNM    Family History Family History  Problem Relation Age of Onset  . Hypertension Mother        also has a benign brain tumor  . Muscular dystrophy Cousin   . Breast cancer Neg Hx   . Deep vein thrombosis Neg Hx     Social History Social History   Tobacco Use  . Smoking status: Never Smoker  . Smokeless tobacco: Never Used  Vaping Use  . Vaping Use: Never used  Substance Use Topics  . Alcohol use: Not Currently    Comment: occasionally  . Drug use: No     Allergies   Kiwi extract, Orange oil, Strawberry extract, and Other   Review of Systems Review of Systems  Constitutional: Negative for activity change, appetite  change and fever.  HENT: Positive for congestion, ear discharge, ear pain and rhinorrhea.   Respiratory: Positive for cough, shortness of breath and wheezing.   Gastrointestinal: Negative for abdominal pain and diarrhea.  Musculoskeletal: Negative for arthralgias and myalgias.  Skin: Negative for rash.  Hematological: Negative.   Psychiatric/Behavioral: Negative.      Physical Exam Triage Vital Signs ED Triage Vitals  Enc Vitals Group     BP 09/20/20 1120 113/68     Pulse Rate 09/20/20 1120 85     Resp 09/20/20 1120 18     Temp 09/20/20 1120 98.1 F (36.7 C)     Temp Source 09/20/20 1120 Oral     SpO2 09/20/20 1120 100 %     Weight 09/20/20 1118 (!) 323 lb (146.5 kg)     Height 09/20/20 1118 _0  (1.753 m)     Head  Circumference --      Peak Flow --      Pain Score 09/20/20 1118 8     Pain Loc --      Pain Edu? --      Excl. in [place]? --    No data found.  Updated Vital Signs BP 113/68 (BP Location: Left Arm)   Pulse 85   Temp 98.1 F (36.7 C) (Oral)   Resp 18   Ht _1  (1.753 m)   Wt (!) 323 lb (146.5 kg)   SpO2 100%   BMI 47.70 kg/m   Visual Acuity Right Eye Distance:   Left Eye Distance:   Bilateral Distance:    Right Eye Near:   Left Eye Near:    Bilateral Near:     Physical Exam Vitals and nursing note reviewed.  Constitutional:      General: She is not in acute distress.    Appearance: Normal appearance. She is not ill-appearing.  HENT:     Head: Normocephalic and atraumatic.     Right Ear: Tympanic membrane, ear canal and external ear normal. There is no impacted cerumen.     Left Ear: Ear canal and external ear normal.     Nose: Congestion and rhinorrhea present.     Mouth/Throat:     Mouth: Mucous membranes are moist.     Pharynx: Oropharynx is clear. No posterior oropharyngeal erythema.  Cardiovascular:     Rate and Rhythm: Normal rate and regular rhythm.     Pulses: Normal pulses.     Heart sounds: Normal heart sounds. No murmur heard. No gallop.   Pulmonary:     Effort: Pulmonary effort is normal.     Breath sounds: Normal breath sounds. No wheezing, rhonchi or rales.  Musculoskeletal:     Cervical back: Normal range of motion and neck supple.  Lymphadenopathy:     Cervical: No cervical adenopathy.  Skin:    General: Skin is warm and dry.     Capillary Refill: Capillary refill takes less than 2 seconds.     Findings: No erythema or rash.  Neurological:     General: No focal deficit present.     Mental Status: She is alert and oriented to person, place, and time.  Psychiatric:        Mood and Affect: Mood normal.        Behavior: Behavior normal.        Thought Content: Thought content normal.        Judgment: Judgment normal.      UC Treatments /  Results  Labs (  all labs ordered are listed, but only abnormal results are displayed) Labs Reviewed - No data to display  EKG   Radiology No results found.  Procedures Procedures (including critical care time)  Medications Ordered in UC Medications - No data to display  Initial Impression / Assessment and Plan / UC Course  I have reviewed the triage vital signs and the nursing notes.  Pertinent labs & imaging results that were available during my care of the patient were reviewed by me and considered in my medical decision making (see chart for details).   Patient is a very pleasant 25 year old female here for evaluation of respiratory complaints that been going on for last week.  Her primary complaint is nasal congestion with cough along with left ear pain with a yellow puslike discharge.  Patient's physical exam reveals an erythematous and injected left tympanic membrane with a clear external auditory canal.  No visible perforation of the TM is evident and there is no debris in the canal.  Right tympanic membrane is within normal limits.  Nasal mucosa is erythematous and edematous with clear nasal discharge.  Patient's posterior oropharynx is unremarkable.  Patient does not have any cervical lymphadenopathy on exam.  Lungs are clear to auscultation all fields.  Patient's exam is consistent with an upper respiratory infection and left otitis media.  We will treat left otitis media with Augmentin twice daily x10 days, Tessalon Perles and Promethazine DM for cough and congestion.   Final Clinical Impressions(s) / UC Diagnoses   Final diagnoses:  Upper respiratory tract infection, unspecified type  Non-recurrent acute suppurative otitis media of left ear without spontaneous rupture of tympanic membrane     Discharge Instructions     The Augmentin twice daily with food for 10 days for treatment of your ear infection.  Tessalon Perles during the day as needed for cough and the  Promethazine DM cough syrup at bedtime since will make you drowsy.  Perform sinus irrigation 2-3 times a day with a NeilMed sinus rinse kit and distilled water.  Do not use tap water.  You can use plain over-the-counter Mucinex every 6 hours to break up the stickiness of the mucus so your body can clear it.  Increase your oral fluid intake to thin out your mucus so that is also able for your body to clear more easily.  Take an over-the-counter probiotic, such as Culturelle-align-activia, 1 hour after each dose of antibiotic to prevent diarrhea.  If you develop any new or worsening symptoms return for reevaluation or see your primary care provider.     ED Prescriptions    Medication Sig Dispense Auth. Provider   amoxicillin-clavulanate (AUGMENTIN) 875-125 MG tablet Take 1 tablet by mouth every 12 (twelve) hours for 10 days. 20 tablet Margarette Canada, NP   benzonatate (TESSALON) 100 MG capsule Take 2 capsules (200 mg total) by mouth every 8 (eight) hours. 21 capsule Margarette Canada, NP   promethazine-dextromethorphan (PROMETHAZINE-DM) 6.25-15 MG/5ML syrup Take 5 mLs by mouth 4 (four) times daily as needed. 118 mL Margarette Canada, NP     PDMP not reviewed this encounter.   Margarette Canada, NP 09/20/20 1159

## 2020-09-20 NOTE — Discharge Instructions (Addendum)
The Augmentin twice daily with food for 10 days for treatment of your ear infection.  Tessalon Perles during the day as needed for cough and the Promethazine DM cough syrup at bedtime since will make you drowsy.  Perform sinus irrigation 2-3 times a day with a NeilMed sinus rinse kit and distilled water.  Do not use tap water.  You can use plain over-the-counter Mucinex every 6 hours to break up the stickiness of the mucus so your body can clear it.  Increase your oral fluid intake to thin out your mucus so that is also able for your body to clear more easily.  Take an over-the-counter probiotic, such as Culturelle-align-activia, 1 hour after each dose of antibiotic to prevent diarrhea.  If you develop any new or worsening symptoms return for reevaluation or see your primary care provider.

## 2020-09-20 NOTE — ED Triage Notes (Signed)
Pt c/o nasal congestion and cough, left ear pain with drainage. Pt states she has had increasing symptoms for about a week. Pt reports she has tried allergy medicine, nasal sprays, Dayquil all with little improvement.

## 2021-06-26 ENCOUNTER — Other Ambulatory Visit: Payer: Self-pay | Admitting: Otolaryngology

## 2021-06-26 DIAGNOSIS — E041 Nontoxic single thyroid nodule: Secondary | ICD-10-CM

## 2021-06-28 ENCOUNTER — Other Ambulatory Visit: Payer: Self-pay | Admitting: Otolaryngology

## 2021-06-28 DIAGNOSIS — E041 Nontoxic single thyroid nodule: Secondary | ICD-10-CM

## 2021-07-02 ENCOUNTER — Ambulatory Visit
Admission: RE | Admit: 2021-07-02 | Discharge: 2021-07-02 | Disposition: A | Discharge status: Home / Self Care | Payer: BC Managed Care – PPO | Source: Ambulatory Visit | Attending: Otolaryngology | Admitting: Otolaryngology

## 2021-07-02 DIAGNOSIS — E041 Nontoxic single thyroid nodule: Secondary | ICD-10-CM

## 2021-09-17 ENCOUNTER — Encounter: Payer: Self-pay | Admitting: Otolaryngology

## 2021-09-18 ENCOUNTER — Encounter: Payer: Self-pay | Admitting: Anesthesiology

## 2021-09-25 ENCOUNTER — Ambulatory Visit: Admission: RE | Admit: 2021-09-25 | Payer: BC Managed Care – PPO | Source: Home / Self Care | Admitting: Otolaryngology

## 2021-09-25 SURGERY — MYRINGOTOMY WITH TUBE PLACEMENT
Anesthesia: General | Laterality: Bilateral

## 2021-10-17 ENCOUNTER — Ambulatory Visit
Admission: RE | Admit: 2021-10-17 | Discharge: 2021-10-17 | Disposition: A | Discharge status: Home / Self Care | Payer: BC Managed Care – PPO | Source: Ambulatory Visit | Attending: Emergency Medicine | Admitting: Emergency Medicine

## 2021-10-17 VITALS — BP 150/78 | HR 99 | Temp 98.6°F | Resp 18

## 2021-10-17 DIAGNOSIS — J029 Acute pharyngitis, unspecified: Secondary | ICD-10-CM | POA: Diagnosis not present

## 2021-10-17 DIAGNOSIS — R030 Elevated blood-pressure reading, without diagnosis of hypertension: Secondary | ICD-10-CM

## 2021-10-17 DIAGNOSIS — H9202 Otalgia, left ear: Secondary | ICD-10-CM | POA: Diagnosis not present

## 2021-10-17 LAB — POCT RAPID STREP A (OFFICE): Rapid Strep A Screen: NEGATIVE

## 2021-10-17 NOTE — Discharge Instructions (Addendum)
Your strep test is negative.  Take Tylenol or ibuprofen as needed for discomfort.  Follow-up with your primary care provider or ENT as needed. ?

## 2021-10-17 NOTE — ED Triage Notes (Signed)
Patient presents to Urgent Care with complaints of sore throat and left ear pain since yesterday. Not taking any medications for symptoms. Has a hx of allergies and ear infections.  ? ?Denies fever. ?

## 2021-10-17 NOTE — ED Provider Notes (Signed)
?[facility] ? ? ? ?CSN: [id number] ?Arrival date & time: 10/17/21  0935 ? ? ?  ? ?History   ?Chief Complaint ?Chief Complaint  ?Patient presents with  ? Sore Throat  ?  Ear ache and sore throat - Entered by patient  ? ? ?HPI ?Morgan Mendoza is a 26 y.o. female.  Patient presents with left ear pain and sore throat x1 day.  Treatment at home with Tylenol taken this morning.  No fever, rash, cough, shortness of breath, vomiting, diarrhea, or other symptoms.  She reports history of frequent ear infections and was scheduled to get PE tubes last month but this had to be rescheduled.  Her medical history includes anemia and obesity. ? ?The history is provided by the patient and medical records.  ? ?Past Medical History:  ?Diagnosis Date  ? Anemia   ? Obesity   ? BMI 42 on 09/19/2016  ? ? ?Patient Active Problem List  ? Diagnosis Date Noted  ? Calculus of gallbladder without cholecystitis without obstruction   ? Choledocholithiasis 09/15/2017  ? Gestational hypertension 09/03/2017  ? Right upper quadrant abdominal pain 09/30/2016  ? Contraceptive management 09/19/2016  ? ? ?Past Surgical History:  ?Procedure Laterality Date  ? CHOLECYSTECTOMY N/A 09/18/2017  ? Procedure: LAPAROSCOPIC CHOLECYSTECTOMY;  Surgeon: Jules Husbands, MD;  Location: [facility];  Service: General;  Laterality: N/A;  ? ENDOSCOPIC RETROGRADE CHOLANGIOPANCREATOGRAPHY (ERCP) WITH PROPOFOL N/A 09/15/2017  ? Procedure: ENDOSCOPIC RETROGRADE CHOLANGIOPANCREATOGRAPHY (ERCP) WITH PROPOFOL;  Surgeon: Lucilla Lame, MD;  Location: [facility];  Service: Endoscopy;  Laterality: N/A;  ? MOUTH SURGERY    ? had tooth removed  ? ? ?OB History   ? ? Gravida  ?1  ? Para  ?1  ? Term  ?1  ? Preterm  ?0  ? AB  ?0  ? Living  ?1  ?  ? ? SAB  ?0  ? IAB  ?0  ? Ectopic  ?0  ? Multiple  ?0  ? Live Births  ?1  ?   ?  ?  ? ? ? ?Home Medications   ? ?Prior to Admission medications   ?Medication Sig Start Date End Date Taking? Authorizing Provider  ?Liraglutide -Weight  Management (SAXENDA [place]) Inject into the skin.    [provider]  ?vitamin B-12 (CYANOCOBALAMIN) 500 MCG tablet Take 500 mcg by mouth daily.    [provider]  ?VITAMIN D PO Take by mouth once a week.    [provider]  ?ferrous sulfate 325 (65 FE) MG EC tablet Take 1 tablet (325 mg total) by mouth 2 (two) times daily with a meal. 09/05/17 09/20/20  Lisette Grinder, CNM  ? ? ?Family History ?Family History  ?Problem Relation Age of Onset  ? Hypertension Mother   ?     also has a benign brain tumor  ? Muscular dystrophy Cousin   ? Breast cancer Neg Hx   ? Deep vein thrombosis Neg Hx   ? ? ?Social History ?Social History  ? ?Tobacco Use  ? Smoking status: Never  ? Smokeless tobacco: Never  ?Vaping Use  ? Vaping Use: Never used  ?Substance Use Topics  ? Alcohol use: Not Currently  ?  Comment: occasionally  ? Drug use: No  ? ? ? ?Allergies   ?Kiwi extract, Orange oil, Strawberry extract, and Other ? ? ?Review of Systems ?Review of Systems  ?Constitutional:  Negative for chills and fever.  ?HENT:  Positive for ear pain  and sore throat.   ?Respiratory:  Negative for cough and shortness of breath.   ?Gastrointestinal:  Negative for diarrhea and vomiting.  ?Skin:  Negative for color change and rash.  ?All other systems reviewed and are negative. ? ? ?Physical Exam ?Triage Vital Signs ?ED Triage Vitals  ?Enc Vitals Group  ?   BP   ?   Pulse   ?   Resp   ?   Temp   ?   Temp src   ?   SpO2   ?   Weight   ?   Height   ?   Head Circumference   ?   Peak Flow   ?   Pain Score   ?   Pain Loc   ?   Pain Edu?   ?   Excl. in [place]?   ? ?No data found. ? ?Updated Vital Signs ?BP (!) 150/78   Pulse 99   Temp 98.6 ?F (37 ?C)   Resp 18   SpO2 98%  ? ?Visual Acuity ?Right Eye Distance:   ?Left Eye Distance:   ?Bilateral Distance:   ? ?Right Eye Near:   ?Left Eye Near:    ?Bilateral Near:    ? ?Physical Exam ?Vitals and nursing note reviewed.  ?Constitutional:   ?   General: She is not in acute distress. ?    Appearance: She is well-developed. She is not ill-appearing.  ?HENT:  ?   Right Ear: Tympanic membrane and ear canal normal.  ?   Left Ear: Tympanic membrane and ear canal normal.  ?   Nose: Nose normal.  ?   Mouth/Throat:  ?   Mouth: Mucous membranes are moist.  ?   Pharynx: Posterior oropharyngeal erythema present.  ?Cardiovascular:  ?   Rate and Rhythm: Normal rate and regular rhythm.  ?   Heart sounds: Normal heart sounds.  ?Pulmonary:  ?   Effort: Pulmonary effort is normal. No respiratory distress.  ?   Breath sounds: Normal breath sounds.  ?Musculoskeletal:  ?   Cervical back: Neck supple.  ?Skin: ?   General: Skin is warm and dry.  ?Neurological:  ?   Mental Status: She is alert.  ?Psychiatric:     ?   Mood and Affect: Mood normal.     ?   Behavior: Behavior normal.  ? ? ? ?UC Treatments / Results  ?Labs ?(all labs ordered are listed, but only abnormal results are displayed) ?Labs Reviewed  ?POCT RAPID STREP A (OFFICE)  ? ? ?EKG ? ? ?Radiology ?No results found. ? ?Procedures ?Procedures (including critical care time) ? ?Medications Ordered in UC ?Medications - No data to display ? ?Initial Impression / Assessment and Plan / UC Course  ?I have reviewed the triage vital signs and the nursing notes. ? ?Pertinent labs & imaging results that were available during my care of the patient were reviewed by me and considered in my medical decision making (see chart for details). ? ?  ?Left otalgia, acute pharyngitis, elevated blood pressure reading.  Rapid strep negative.  No indication of ear infection today.  Discussed Tylenol or ibuprofen as needed.  Instructed patient to follow-up with her PCP or ENT.  She agrees to plan of care. ? ?Final Clinical Impressions(s) / UC Diagnoses  ? ?Final diagnoses:  ?Acute otalgia, left  ?Acute pharyngitis, unspecified etiology  ?Elevated blood pressure reading  ? ? ? ?Discharge Instructions   ? ?  ?Your strep test is negative.  Take Tylenol or ibuprofen as needed for discomfort.   Follow-up with your primary care provider or ENT as needed. ? ? ? ? ?ED Prescriptions   ?None ?  ? ?PDMP not reviewed this encounter. ?  ?Sharion Balloon, NP ?10/17/21 1036 ? ?
